# Patient Record
Sex: Female | Born: 1970
Health system: Southern US, Community
[De-identification: ages and names within clinical notes are randomized; demographics above are authoritative.]

## PROBLEM LIST (undated history)

## (undated) DIAGNOSIS — F329 Major depressive disorder, single episode, unspecified: Secondary | ICD-10-CM

## (undated) DIAGNOSIS — F32A Depression, unspecified: Secondary | ICD-10-CM

## (undated) DIAGNOSIS — Z923 Personal history of irradiation: Secondary | ICD-10-CM

## (undated) DIAGNOSIS — D0510 Intraductal carcinoma in situ of unspecified breast: Secondary | ICD-10-CM

## (undated) HISTORY — PX: DILATION AND CURETTAGE OF UTERUS: SHX78

## (undated) HISTORY — PX: BREAST SURGERY: SHX581

## (undated) HISTORY — PX: APPENDECTOMY: SHX54

---

## 2003-02-26 ENCOUNTER — Other Ambulatory Visit: Admission: RE | Admit: 2003-02-26 | Discharge: 2003-02-26 | Payer: Self-pay | Admitting: Obstetrics and Gynecology

## 2003-10-18 ENCOUNTER — Ambulatory Visit (HOSPITAL_COMMUNITY): Admission: RE | Admit: 2003-10-18 | Discharge: 2003-10-18 | Payer: Self-pay | Admitting: Obstetrics and Gynecology

## 2004-01-06 ENCOUNTER — Inpatient Hospital Stay (HOSPITAL_COMMUNITY): Admission: AD | Admit: 2004-01-06 | Discharge: 2004-01-09 | Payer: Self-pay | Admitting: Obstetrics and Gynecology

## 2004-01-07 ENCOUNTER — Encounter (INDEPENDENT_AMBULATORY_CARE_PROVIDER_SITE_OTHER): Payer: Self-pay | Admitting: Specialist

## 2006-08-02 ENCOUNTER — Encounter: Admission: RE | Admit: 2006-08-02 | Discharge: 2006-08-02 | Payer: Self-pay | Admitting: Obstetrics and Gynecology

## 2006-08-12 ENCOUNTER — Encounter: Admission: RE | Admit: 2006-08-12 | Discharge: 2006-08-12 | Payer: Self-pay | Admitting: Obstetrics and Gynecology

## 2009-11-11 ENCOUNTER — Encounter: Admission: RE | Admit: 2009-11-11 | Discharge: 2009-11-11 | Payer: Self-pay | Admitting: Obstetrics and Gynecology

## 2010-08-03 ENCOUNTER — Encounter: Payer: Self-pay | Admitting: Obstetrics and Gynecology

## 2011-09-11 ENCOUNTER — Other Ambulatory Visit (HOSPITAL_BASED_OUTPATIENT_CLINIC_OR_DEPARTMENT_OTHER): Payer: Self-pay | Admitting: Obstetrics and Gynecology

## 2011-09-11 DIAGNOSIS — Z1231 Encounter for screening mammogram for malignant neoplasm of breast: Secondary | ICD-10-CM

## 2011-09-16 ENCOUNTER — Ambulatory Visit (HOSPITAL_BASED_OUTPATIENT_CLINIC_OR_DEPARTMENT_OTHER)
Admission: RE | Admit: 2011-09-16 | Discharge: 2011-09-16 | Disposition: A | Discharge status: Home / Self Care | Payer: Managed Care, Other (non HMO) | Source: Ambulatory Visit | Attending: Obstetrics and Gynecology | Admitting: Obstetrics and Gynecology

## 2011-09-16 DIAGNOSIS — Z1231 Encounter for screening mammogram for malignant neoplasm of breast: Secondary | ICD-10-CM | POA: Insufficient documentation

## 2011-09-18 ENCOUNTER — Other Ambulatory Visit: Payer: Self-pay | Admitting: Obstetrics and Gynecology

## 2011-09-18 DIAGNOSIS — R928 Other abnormal and inconclusive findings on diagnostic imaging of breast: Secondary | ICD-10-CM

## 2011-09-30 ENCOUNTER — Ambulatory Visit
Admission: RE | Admit: 2011-09-30 | Discharge: 2011-09-30 | Disposition: A | Discharge status: Home / Self Care | Payer: Managed Care, Other (non HMO) | Source: Ambulatory Visit | Attending: Obstetrics and Gynecology | Admitting: Obstetrics and Gynecology

## 2011-09-30 DIAGNOSIS — R928 Other abnormal and inconclusive findings on diagnostic imaging of breast: Secondary | ICD-10-CM

## 2012-09-26 ENCOUNTER — Other Ambulatory Visit: Payer: Self-pay | Admitting: Obstetrics and Gynecology

## 2012-10-05 ENCOUNTER — Encounter (HOSPITAL_COMMUNITY): Payer: Self-pay

## 2012-11-07 ENCOUNTER — Encounter (HOSPITAL_COMMUNITY): Payer: Self-pay | Admitting: Pharmacist

## 2012-11-15 ENCOUNTER — Encounter (HOSPITAL_COMMUNITY): Admission: RE | Payer: Self-pay | Source: Ambulatory Visit

## 2012-11-15 ENCOUNTER — Encounter (HOSPITAL_COMMUNITY): Payer: Self-pay | Admitting: Anesthesiology

## 2012-11-15 ENCOUNTER — Ambulatory Visit (HOSPITAL_COMMUNITY)
Admission: RE | Admit: 2012-11-15 | Payer: Managed Care, Other (non HMO) | Source: Ambulatory Visit | Admitting: Obstetrics and Gynecology

## 2012-11-15 HISTORY — DX: Depression, unspecified: F32.A

## 2012-11-15 HISTORY — DX: Major depressive disorder, single episode, unspecified: F32.9

## 2012-11-15 SURGERY — DILATATION & CURETTAGE/HYSTEROSCOPY WITH NOVASURE ABLATION
Anesthesia: General

## 2017-03-19 ENCOUNTER — Encounter (HOSPITAL_BASED_OUTPATIENT_CLINIC_OR_DEPARTMENT_OTHER): Payer: Self-pay | Admitting: *Deleted

## 2017-03-19 ENCOUNTER — Emergency Department (HOSPITAL_BASED_OUTPATIENT_CLINIC_OR_DEPARTMENT_OTHER)
Admission: EM | Admit: 2017-03-19 | Discharge: 2017-03-19 | Disposition: A | Discharge status: Home / Self Care | Payer: Worker's Compensation | Attending: Emergency Medicine | Admitting: Emergency Medicine

## 2017-03-19 ENCOUNTER — Emergency Department (HOSPITAL_BASED_OUTPATIENT_CLINIC_OR_DEPARTMENT_OTHER): Payer: Worker's Compensation

## 2017-03-19 DIAGNOSIS — Y999 Unspecified external cause status: Secondary | ICD-10-CM | POA: Diagnosis not present

## 2017-03-19 DIAGNOSIS — W458XXA Other foreign body or object entering through skin, initial encounter: Secondary | ICD-10-CM | POA: Insufficient documentation

## 2017-03-19 DIAGNOSIS — S91141A Puncture wound with foreign body of right great toe without damage to nail, initial encounter: Secondary | ICD-10-CM | POA: Diagnosis not present

## 2017-03-19 DIAGNOSIS — Y929 Unspecified place or not applicable: Secondary | ICD-10-CM | POA: Diagnosis not present

## 2017-03-19 DIAGNOSIS — Y9301 Activity, walking, marching and hiking: Secondary | ICD-10-CM | POA: Insufficient documentation

## 2017-03-19 DIAGNOSIS — Z79899 Other long term (current) drug therapy: Secondary | ICD-10-CM | POA: Insufficient documentation

## 2017-03-19 DIAGNOSIS — T148XXA Other injury of unspecified body region, initial encounter: Secondary | ICD-10-CM

## 2017-03-19 DIAGNOSIS — S99921A Unspecified injury of right foot, initial encounter: Secondary | ICD-10-CM | POA: Diagnosis present

## 2017-03-19 MED ORDER — HYDROCODONE-ACETAMINOPHEN 5-325 MG PO TABS: 1.0000 | ORAL_TABLET | Freq: Four times a day (QID) | ORAL | 0 refills | Status: DC | PRN

## 2017-03-19 MED ORDER — CEPHALEXIN 500 MG PO CAPS: 500.0000 mg | ORAL_CAPSULE | Freq: Three times a day (TID) | ORAL | 0 refills | Status: DC

## 2017-03-19 MED ORDER — LIDOCAINE HCL 2 % IJ SOLN
5.0000 mL | Freq: Once | INTRAMUSCULAR | Status: AC
  Administered 2017-03-19: 100 mg via INTRADERMAL
  Filled 2017-03-19: qty 20

## 2017-03-19 MED FILL — CEPHALEXIN 500 MG CAPSULE: 500 | 7 days supply | Qty: 21 | Fill #0

## 2017-03-19 MED FILL — HYDROCODON-APAP 5-325: 5-325 | 1 days supply | Qty: 6 | Fill #0

## 2017-03-19 NOTE — ED Notes (Signed)
Bacitracin was placed on wound and wrapped with guaze.

## 2017-03-19 NOTE — Discharge Instructions (Signed)
There may be some of the splinter remaining in the skin of your toe. Perform soaks several times a day. Take Keflex as prescribed to prevent infection. Take pain medication as prescribed as needed for severe pain only. Follow-up with your doctor or podiatrist. Return if worsening.

## 2017-03-19 NOTE — ED Notes (Signed)
ED Provider at bedside. 

## 2017-03-19 NOTE — ED Provider Notes (Signed)
[facility] Provider Note   CSN: [id number] Arrival date & time: 03/19/17  2130     History   Chief Complaint Chief Complaint  Patient presents with  . Foreign Body in Skin    HPI Pier Laux Mathison is a 46 y.o. female.  HPI ALEYNA CUEVA is a 47 y.o. female who presents to emergency department complaining of a splinter in her right great toe. Patient states she was wearing open toe shoes yesterday, walking outside, felt something stab her in detail. She has tried remove the splinter herself, but was unable to. She has soaked her foot at home. She applied topical lidocaine which did not help. She denies any redness or swelling. No drainage. Tetanus within 5 years.  Past Medical History:  Diagnosis Date  . Depression     There are no active problems to display for this patient.   Past Surgical History:  Procedure Laterality Date  . BREAST SURGERY    . DILATION AND CURETTAGE OF UTERUS      OB History    No data available       Home Medications    Prior to Admission medications   Medication Sig Start Date End Date Taking? Authorizing Provider  BuPROPion HCl (WELLBUTRIN XL PO) Take by mouth.   Yes [provider]  Sertraline HCl (ZOLOFT PO) Take by mouth.   Yes [provider]    Family History No family history on file.  Social History Social History  Substance Use Topics  . Smoking status: Never Smoker  . Smokeless tobacco: Never Used  . Alcohol use Yes     Comment: monthly     Allergies   Erythromycin   Review of Systems Review of Systems  Constitutional: Negative for chills and fever.  Musculoskeletal: Positive for arthralgias.  Skin: Positive for wound. Negative for color change.     Physical Exam Updated Vital Signs BP 120/72 (BP Location: Left Arm)   Pulse 76   Temp 98.7 F (37.1 C) (Oral)   Resp 18   Ht  (1.676 m)   Wt 65.8 kg (145 lb)   SpO2 100%   BMI 23.40 kg/m   Physical Exam    Constitutional: She appears well-developed and well-nourished. No distress.  Eyes: Conjunctivae are normal.  Neck: Neck supple.  Neurological: She is alert.  Skin: Skin is warm and dry.  Black pin point foreign body in the tip of the right great toe just medial to the toenail.   Nursing note and vitals reviewed.    ED Treatments / Results  Labs (all labs ordered are listed, but only abnormal results are displayed) Labs Reviewed - No data to display  EKG  EKG Interpretation None       Radiology Dg Toe Great Right  Result Date: 03/19/2017 CLINICAL DATA:  Injury, possible foreign body EXAM: RIGHT GREAT TOE COMPARISON:  None available FINDINGS: Mild soft tissue swelling. No radiopaque foreign body. No acute osseous finding or malalignment. Negative for fracture. IMPRESSION: No acute finding by plain radiography Electronically Signed   By: Judie Petit.  Shick M.D.   On: 03/19/2017 11:21    Procedures .Foreign Body Removal Date/Time: 03/19/2017 11:39 AM Performed by: Jaynie Crumble Authorized by: Jaynie Crumble  Consent: Verbal consent obtained. Patient understanding: patient states understanding of the procedure being performed Body area: skin General location: lower extremity Location details: right big toe Anesthesia: local infiltration  Anesthesia: Local Anesthetic: lidocaine 1% without epinephrine  Sedation: Patient sedated:  no Complexity: complex 2 objects recovered. Objects recovered: piece of grass and pieces of splinter Post-procedure assessment: residual foreign bodies remain Patient tolerance: Patient tolerated the procedure well with no immediate complications   (including critical care time)  Medications Ordered in ED Medications  lidocaine (XYLOCAINE) 2 % (with pres) injection 100 mg (not administered)     Initial Impression / Assessment and Plan / ED Course  I have reviewed the triage vital signs and the nursing notes.  Pertinent labs & imaging  results that were available during my care of the patient were reviewed by me and considered in my medical decision making (see chart for details).     Patient with a splinter to the right great toe. Was able to remove parts of the splinter, and a long piece of grass, the splinter brakes whenever you try to pull on it. some pieces remains that I am able to visualize but unable to move. Discussed with dr. Silverio LayYao.  Will dc home with soaks, podiatry follow up, keflex. Return precautions discussed.   Vitals:   03/19/17 0930  BP: 120/72  Pulse: 76  Resp: 18  Temp: 98.7 F (37.1 C)  TempSrc: Oral  SpO2: 100%  Weight: 65.8 kg (145 lb)  Height: 5\' 6"  (1.676 m)      Final Clinical Impressions(s) / ED Diagnoses   Final diagnoses:  Foreign body in skin    New Prescriptions New Prescriptions   CEPHALEXIN (KEFLEX) 500 MG CAPSULE    Take 1 capsule (500 mg total) by mouth 3 (three) times daily.   HYDROCODONE-ACETAMINOPHEN (NORCO) 5-325 MG TABLET    Take 1 tablet by mouth every 6 (six) hours as needed for moderate pain.     Jaynie CrumbleKirichenko, Kalisi Bevill, PA-C 03/19/17 1147    Charlynne PanderYao, David Hsienta, MD 03/19/17 [phone]

## 2017-03-19 NOTE — ED Triage Notes (Signed)
Pt states she was walking in yard yesterday and got a splinter in her right great toe

## 2017-03-19 NOTE — ED Notes (Signed)
Pt given warm soap water to soak toe.

## 2018-12-18 IMAGING — CR DG TOE GREAT 2+V*R*
3 series · 3 of 3 positions shown · non-contrast
Comparison: None available

CLINICAL DATA: Injury, possible foreign body

EXAM:
RIGHT GREAT TOE

[t toes ap right]
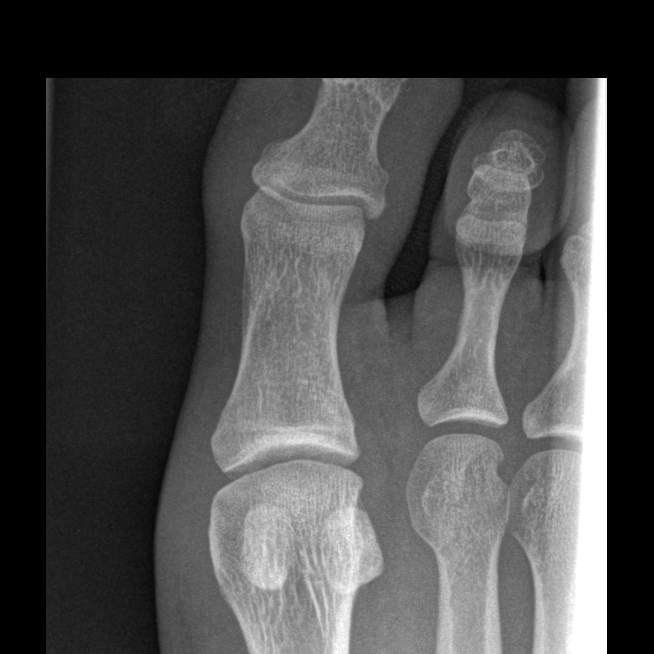

[t toes oblique right]
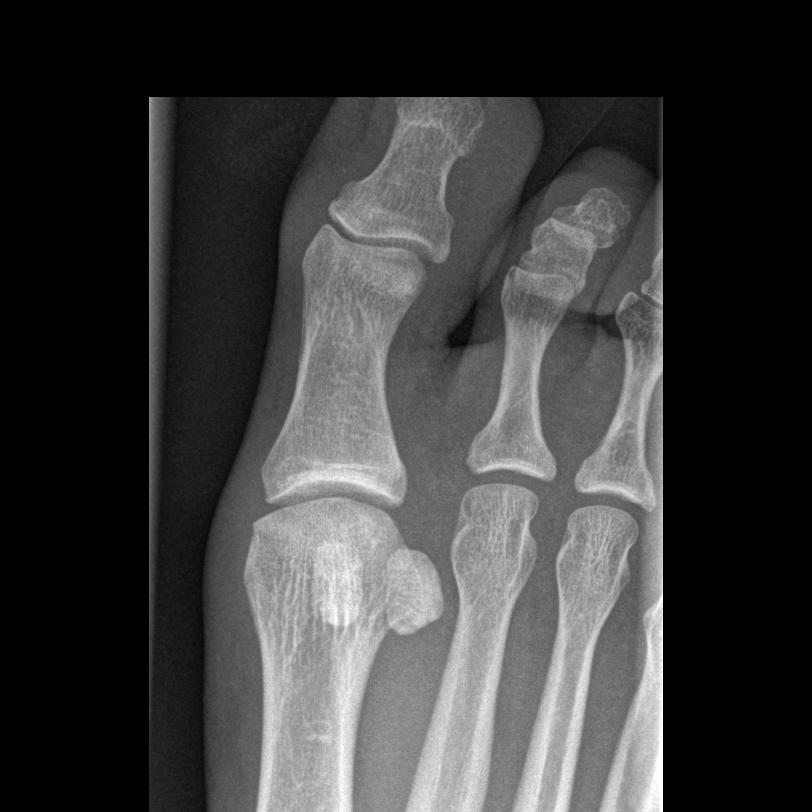

[t toes lateral right]
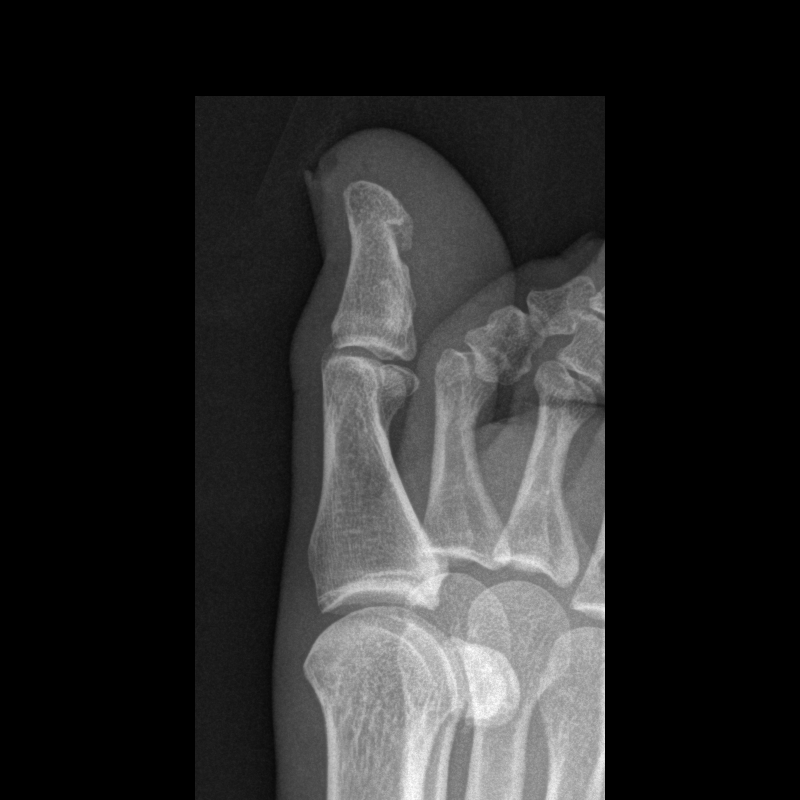

[3 of 3 positions shown; findings below may reference images not displayed]

FINDINGS: Mild soft tissue swelling. No radiopaque foreign body. No acute
osseous finding or malalignment. Negative for fracture.
IMPRESSION: No acute finding by plain radiography

## 2021-07-22 ENCOUNTER — Encounter: Payer: Managed Care, Other (non HMO) | Admitting: Genetic Counselor

## 2021-11-05 ENCOUNTER — Ambulatory Visit: Payer: Self-pay

## 2022-07-15 ENCOUNTER — Other Ambulatory Visit: Payer: Self-pay

## 2022-07-17 ENCOUNTER — Telehealth: Payer: Self-pay | Admitting: Hematology and Oncology

## 2022-07-17 NOTE — Telephone Encounter (Signed)
Spoke to patient to confirm upcoming afternoon [facility] clinic appointment  on 1/10, paperwork will be sent via [place]. [facility] Seamen location and time, also informed patient that the surgeon's office would be calling as well to get information from them similar to the packet that they will be receiving so make sure to do both.  Reminded patient that all providers will be coming to the clinic to see them HERE and if they had any questions to not hesitate to reach back out to myself or their navigators.

## 2022-07-17 NOTE — Telephone Encounter (Signed)
Left message for patient to return call in reference to upcoming clinic appointment on 1/10

## 2022-07-20 ENCOUNTER — Encounter: Payer: Self-pay | Admitting: *Deleted

## 2022-07-20 ENCOUNTER — Encounter: Payer: Self-pay | Admitting: Obstetrics and Gynecology

## 2022-07-20 DIAGNOSIS — D0511 Intraductal carcinoma in situ of right breast: Secondary | ICD-10-CM | POA: Insufficient documentation

## 2022-07-21 NOTE — Progress Notes (Signed)
Radiation Oncology         [phone] ________________________________  Multidisciplinary Breast Oncology Clinic [facility]) Initial Outpatient Consultation  Name: Samantha Mcdowell MRN: [id number]  Date: 07/22/2022  DOB: Nov 07, 1970  YS:[id number], Nena Jordan, MD  Harriette Bouillon, MD   REFERRING PHYSICIAN: Harriette Bouillon, MD  DIAGNOSIS: There were no encounter diagnoses.  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  HISTORY OF PRESENT ILLNESS::Samantha Mcdowell is a 52 y.o. female who is presenting to the office today for evaluation of her newly diagnosed breast cancer. She is accompanied by ***. She is doing well overall.   The patient presented with pain in the 12 o'clock region of both breasts x 1 month. This prompted a bilateral diagnostic mammography with tomography at [facility] on 07/01/22 showing: indeterminate grouped course calcifications in the right breast. Ultrasound of the right breast on that same date further revealed a hypoechoic area with possible calcifications in the 10 o'clock right breast, located 5 cmfn, possibly but not definitely correlating with mammographic findings. [place] otherwise showed no findings in the left breast or evidence of lymphadenopathy in either axilla.   Biopsy of the right breast on 07/15/22 showed: high grade DCIS measuring 0.8 cm in the greatest linear extent of the sample, with necrosis and calcifications. Prognostic indicators significant for: estrogen receptor, 40% positive with weak-moderate staining intensity and progesterone receptor, 2% positive with moderate-strong staining intensity. HER2 not assessed.  Menarche: *** years old Age at first live birth: *** years old GP: *** LMP: *** Contraceptive: *** HRT: ***   The patient was referred today for presentation in the multidisciplinary conference.  Radiology studies and pathology slides were presented there  for review and discussion of treatment options.  A consensus was discussed regarding potential next steps.  PREVIOUS RADIATION THERAPY: {EXAM; YES/NO:19492::"No"}  PAST MEDICAL HISTORY:  Past Medical History:  Diagnosis Date   Depression     PAST SURGICAL HISTORY: Past Surgical History:  Procedure Laterality Date   BREAST SURGERY     DILATION AND CURETTAGE OF UTERUS      FAMILY HISTORY: No family history on file.  SOCIAL HISTORY:  Social History   Socioeconomic History   Marital status: Married    Spouse name: Not on file   Number of children: Not on file   Years of education: Not on file   Highest education level: Not on file  Occupational History   Not on file  Tobacco Use   Smoking status: Never   Smokeless tobacco: Never  Vaping Use   Vaping Use: Never used  Substance and Sexual Activity   Alcohol use: Yes    Comment: monthly   Drug use: No   Sexual activity: Not on file  Other Topics Concern   Not on file  Social History Narrative   Not on file   Social Determinants of Health   Financial Resource Strain: Not on file  Food Insecurity: Not on file  Transportation Needs: Not on file  Physical Activity: Not on file  Stress: Not on file  Social Connections: Not on file    ALLERGIES:  Allergies  Allergen Reactions   Erythromycin Nausea Only    MEDICATIONS:  Current Outpatient Medications  Medication Sig Dispense Refill   BuPROPion HCl (WELLBUTRIN XL PO) Take by mouth.     cephALEXin (KEFLEX) 500 MG capsule Take 1 capsule (500 mg total) by mouth 3 (three) times  daily. 21 capsule 0   HYDROcodone-acetaminophen (NORCO) 5-325 MG tablet Take 1 tablet by mouth every 6 (six) hours as needed for moderate pain. 6 tablet 0   Sertraline HCl (ZOLOFT PO) Take by mouth.     No current facility-administered medications for this encounter.    REVIEW OF SYSTEMS: A 10+ POINT REVIEW OF SYSTEMS WAS OBTAINED including neurology, dermatology, psychiatry, cardiac,  respiratory, lymph, extremities, GI, GU, musculoskeletal, constitutional, reproductive, HEENT. On the provided form, she reports ***. She denies *** and any other symptoms.    PHYSICAL EXAM:  vitals were not taken for this visit.    Lungs are clear to auscultation bilaterally. Heart has regular rate and rhythm. No palpable cervical, supraclavicular, or axillary adenopathy. Abdomen soft, non-tender, normal bowel sounds. Breast: Left breast with no palpable mass, nipple discharge, or bleeding. Right breast with ***.   KPS = ***  100 - Normal; no complaints; no evidence of disease. 90   - Able to carry on normal activity; minor signs or symptoms of disease. 80   - Normal activity with effort; some signs or symptoms of disease. 46   - Cares for self; unable to carry on normal activity or to do active work. 60   - Requires occasional assistance, but is able to care for most of his personal needs. 50   - Requires considerable assistance and frequent medical care. 39   - Disabled; requires special care and assistance. 43   - Severely disabled; hospital admission is indicated although death not imminent. 43   - Very sick; hospital admission necessary; active supportive treatment necessary. 10   - Moribund; fatal processes progressing rapidly. 0     - Dead  Karnofsky DA, Abelmann WH, Craver LS and Burchenal JH [phone]) The use of the nitrogen mustards in the palliative treatment of carcinoma: with particular reference to bronchogenic carcinoma Cancer 1 [id number]  LABORATORY DATA:  No results found for: "WBC", "HGB", "HCT", "MCV", "PLT" No results found for: "NA", "K", "CL", "CO2" No results found for: "ALT", "AST", "GGT", "ALKPHOS", "BILITOT"  PULMONARY FUNCTION TEST:   Review Flowsheet        No data to display          RADIOGRAPHY: No results found.    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 We discussed the general course of radiation, potential side effects, and toxicities with radiation and the patient is interested in this approach. ***   PLAN:  ***   ------------------------------------------------  Blair Promise, PhD, MD  This document serves as a record of services personally performed by Gery Pray, MD. It was created on his behalf by Roney Mans, a trained medical scribe. The creation of this record is based on the scribe's personal observations and the provider's statements to them. This document has been checked and approved by the attending provider.

## 2022-07-22 ENCOUNTER — Ambulatory Visit
Admission: RE | Admit: 2022-07-22 | Discharge: 2022-07-22 | Disposition: A | Discharge status: Home / Self Care | Payer: BC Managed Care – PPO | Source: Ambulatory Visit | Attending: Radiation Oncology | Admitting: Radiation Oncology

## 2022-07-22 ENCOUNTER — Encounter: Payer: Self-pay | Admitting: *Deleted

## 2022-07-22 ENCOUNTER — Ambulatory Visit: Payer: BC Managed Care – PPO | Admitting: Physical Therapy

## 2022-07-22 ENCOUNTER — Other Ambulatory Visit: Payer: Self-pay

## 2022-07-22 ENCOUNTER — Ambulatory Visit: Payer: Self-pay | Admitting: Surgery

## 2022-07-22 ENCOUNTER — Inpatient Hospital Stay (HOSPITAL_BASED_OUTPATIENT_CLINIC_OR_DEPARTMENT_OTHER): Payer: BC Managed Care – PPO | Admitting: Hematology and Oncology

## 2022-07-22 ENCOUNTER — Inpatient Hospital Stay (HOSPITAL_BASED_OUTPATIENT_CLINIC_OR_DEPARTMENT_OTHER): Payer: BC Managed Care – PPO | Admitting: Genetic Counselor

## 2022-07-22 ENCOUNTER — Inpatient Hospital Stay: Payer: BC Managed Care – PPO | Attending: Hematology and Oncology

## 2022-07-22 ENCOUNTER — Encounter: Payer: Self-pay | Admitting: General Practice

## 2022-07-22 VITALS — BP 109/51 | HR 70 | Temp 97.6°F | Resp 18 | Ht 66.0 in | Wt 153.0 lb

## 2022-07-22 DIAGNOSIS — D0511 Intraductal carcinoma in situ of right breast: Secondary | ICD-10-CM

## 2022-07-22 DIAGNOSIS — Z808 Family history of malignant neoplasm of other organs or systems: Secondary | ICD-10-CM | POA: Insufficient documentation

## 2022-07-22 LAB — CBC WITH DIFFERENTIAL (CANCER CENTER ONLY)
Abs Immature Granulocytes: 0.01 10*3/uL (ref 0.00–0.07)
Basophils Absolute: 0.1 10*3/uL (ref 0.0–0.1)
Basophils Relative: 1 %
Eosinophils Absolute: 0.2 10*3/uL (ref 0.0–0.5)
Eosinophils Relative: 4 %
HCT: 40.7 % (ref 36.0–46.0)
Hemoglobin: 13.8 g/dL (ref 12.0–15.0)
Immature Granulocytes: 0 %
Lymphocytes Relative: 42 %
Lymphs Abs: 2.3 10*3/uL (ref 0.7–4.0)
MCH: 30.1 pg (ref 26.0–34.0)
MCHC: 33.9 g/dL (ref 30.0–36.0)
MCV: 88.7 fL (ref 80.0–100.0)
Monocytes Absolute: 0.3 10*3/uL (ref 0.1–1.0)
Monocytes Relative: 6 %
Neutro Abs: 2.6 10*3/uL (ref 1.7–7.7)
Neutrophils Relative %: 47 %
Platelet Count: 301 10*3/uL (ref 150–400)
RBC: 4.59 MIL/uL (ref 3.87–5.11)
RDW: 12.5 % (ref 11.5–15.5)
WBC Count: 5.5 10*3/uL (ref 4.0–10.5)
nRBC: 0 % (ref 0.0–0.2)

## 2022-07-22 LAB — CMP (CANCER CENTER ONLY)
ALT: 35 U/L (ref 0–44)
AST: 26 U/L (ref 15–41)
Albumin: 4.4 g/dL (ref 3.5–5.0)
Alkaline Phosphatase: 126 U/L (ref 38–126)
Anion gap: 3 — ABNORMAL LOW (ref 5–15)
BUN: 17 mg/dL (ref 6–20)
CO2: 32 mmol/L (ref 22–32)
Calcium: 9.3 mg/dL (ref 8.9–10.3)
Chloride: 105 mmol/L (ref 98–111)
Creatinine: 0.85 mg/dL (ref 0.44–1.00)
GFR, Estimated: >60 mL/min (ref >60)
Glucose, Bld: 86 mg/dL (ref 70–99)
Potassium: 3.7 mmol/L (ref 3.5–5.1)
Sodium: 140 mmol/L (ref 135–145)
Total Bilirubin: 0.3 mg/dL (ref 0.3–1.2)
Total Protein: 7.1 g/dL (ref 6.5–8.1)

## 2022-07-22 LAB — GENETIC SCREENING ORDER

## 2022-07-22 NOTE — Progress Notes (Signed)
[place] Psychosocial Distress Screening Spiritual Care  Spiritual Care was referred by distress screening protocol.  The patient scored a  [unspecified]  on the Psychosocial Distress Thermometer which indicates  [unspecified]  distress. Chaplain met with Ms Tieken and her fiance Henrene Pastor in exam room to assess for distress and other psychosocial needs.     Distress Screen:    07/22/2022    2:39 PM  ONCBCN DISTRESS SCREENING  Screening Type Initial Screening  Emotional problem type Nervousness/Anxiety  Referral to support programs Yes    Chaplain and patient discussed common feelings and emotions when being diagnosed with cancer, and the importance of support during treatment.  Chaplain informed patient of the support team and support services at [facility]  Chaplain provided contact information and encouraged patient to call with any questions or concerns.  Chaplain follow up needed: No.  Ms Frith plans to reach out as needed/desired.    Patient is participating in a Managed Medicaid Plan:  no

## 2022-07-22 NOTE — Progress Notes (Signed)
[place] CONSULT NOTE  Patient Care Team: Servando Salina, MD as PCP - General (Obstetrics and Gynecology) Mauro Kaufmann, RN as Oncology Nurse Navigator Rockwell Germany, RN as Oncology Nurse Navigator Nicholas Lose, MD as Consulting Physician (Hematology and Oncology) Gery Pray, MD as Consulting Physician (Radiation Oncology) Erroll Luna, MD as Consulting Physician (General Surgery)  CHIEF COMPLAINTS/PURPOSE OF CONSULTATION:  Newly diagnosed right breast DCIS  HISTORY OF PRESENTING ILLNESS:  Samantha Mcdowell 52 y.o. female is here because of recent diagnosis of right breast DCIS.  Patient complained of focal pain in bilateral breast but there was no abnormality at the site of the pain.  Incidentally mammogram detected asymmetry and calcifications which on biopsy came back as high-grade DCIS that was ER/PR positive.  She was presented this morning to the multidisciplinary tumor board and she is here today accompanied by her husband to discuss her treatment plan.  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  I reviewed her records extensively and collaborated the history with the patient.  SUMMARY OF ONCOLOGIC HISTORY: Oncology History  Ductal carcinoma in situ (DCIS) of right breast  07/15/2022 Initial Diagnosis   Focal pain in bilateral breasts. mammogram detected Occasions and asymmetry in the right breast upper outer quadrant (implants).  No ultrasound correlate.  Biopsy revealed high-grade DCIS with necrosis ER 40% PR 2%   07/22/2022 Cancer Staging   Staging form: Breast, AJCC 8th Edition - Clinical: Stage 0 (cTis (DCIS), cN0, cM0, ER+, PR+, HER2: Not Assessed) - Signed by Nicholas Lose, MD on 07/22/2022 Stage prefix: Initial diagnosis Nuclear grade: G3      MEDICAL HISTORY:  Past Medical History:  Diagnosis Date   Depression     SURGICAL HISTORY: Past Surgical History:  Procedure Laterality Date   APPENDECTOMY     BREAST SURGERY      DILATION AND CURETTAGE OF UTERUS      SOCIAL HISTORY: Social History   Socioeconomic History   Marital status: Married    Spouse name: Not on file   Number of children: Not on file   Years of education: Not on file   Highest education level: Not on file  Occupational History   Not on file  Tobacco Use   Smoking status: Never   Smokeless tobacco: Never  Vaping Use   Vaping Use: Never used  Substance and Sexual Activity   Alcohol use: Yes    Comment: monthly   Drug use: No   Sexual activity: Not on file  Other Topics Concern   Not on file  Social History Narrative   Not on file   Social Determinants of Health   Financial Resource Strain: Not on file  Food Insecurity: Not on file  Transportation Needs: Not on file  Physical Activity: Not on file  Stress: Not on file  Social Connections: Not on file  Intimate Partner Violence: Not on file    FAMILY HISTORY: Family History  Problem Relation Age of Onset   Uterine cancer Mother    Melanoma Father     ALLERGIES:  is allergic to erythromycin.  MEDICATIONS:  Current Outpatient Medications  Medication Sig Dispense Refill   BuPROPion HCl (WELLBUTRIN XL PO) Take by mouth.     Sertraline HCl (ZOLOFT PO) Take by mouth.     No current facility-administered medications for this visit.    REVIEW OF SYSTEMS:   Constitutional: Denies fevers, chills or abnormal night sweats   All other systems were reviewed  with the patient and are negative.  PHYSICAL EXAMINATION: ECOG PERFORMANCE STATUS: 0 - Asymptomatic  Vitals:   07/22/22 1244  BP: (!) 109/51  Pulse: 70  Resp: 18  Temp: 97.6 F (36.4 C)  SpO2: 100%   Filed Weights   07/22/22 1244  Weight: 153 lb (69.4 kg)    GENERAL:alert, no distress and comfortable    LABORATORY DATA:  I have reviewed the data as listed Lab Results  Component Value Date   WBC 5.5 07/22/2022   HGB 13.8 07/22/2022   HCT 40.7 07/22/2022   MCV 88.7 07/22/2022   PLT 301  07/22/2022   Lab Results  Component Value Date   NA 140 07/22/2022   K 3.7 07/22/2022   CL 105 07/22/2022   CO2 32 07/22/2022    RADIOGRAPHIC STUDIES: I have personally reviewed the radiological reports and agreed with the findings in the report.  ASSESSMENT AND PLAN:  Ductal carcinoma in situ (DCIS) of right breast 07/15/2022:Focal pain in bilateral breasts. mammogram detected Occasions and asymmetry in the right breast upper outer quadrant (implants).  No ultrasound correlate.  Biopsy revealed high-grade DCIS with necrosis ER 40% PR 2%  Pathology review: I discussed with the patient the difference between DCIS and invasive breast cancer. It is considered a precancerous lesion. DCIS is classified as a 0. It is generally detected through mammograms as calcifications. We discussed the significance of grades and its impact on prognosis. We also discussed the importance of ER and PR receptors and their implications to adjuvant treatment options. Prognosis of DCIS dependence on grade, comedo necrosis. It is anticipated that if not treated, 20-30% of DCIS can develop into invasive breast cancer.  Recommendation: 1. Breast conserving surgery 2. Followed by adjuvant radiation therapy 3. Followed by antiestrogen therapy with tamoxifen 5 years  Tamoxifen counseling: We discussed the risks and benefits of tamoxifen. These include but not limited to insomnia, hot flashes, mood changes, vaginal dryness, and weight gain. Although rare, serious side effects including endometrial cancer, risk of blood clots were also discussed. We strongly believe that the benefits far outweigh the risks. Patient understands these risks and consented to starting treatment. Planned treatment duration is 5 years.  Return to clinic after surgery to discuss the final pathology report and come up with an adjuvant treatment plan.   All questions were answered. The patient knows to call the clinic with any problems, questions  or concerns.    Harriette Ohara, MD 07/22/22

## 2022-07-22 NOTE — Assessment & Plan Note (Signed)
07/15/2022:Focal pain in bilateral breasts. mammogram detected Occasions and asymmetry in the right breast upper outer quadrant (implants).  No ultrasound correlate.  Biopsy revealed high-grade DCIS with necrosis ER 40% PR 2%  Pathology review: I discussed with the patient the difference between DCIS and invasive breast cancer. It is considered a precancerous lesion. DCIS is classified as a 0. It is generally detected through mammograms as calcifications. We discussed the significance of grades and its impact on prognosis. We also discussed the importance of ER and PR receptors and their implications to adjuvant treatment options. Prognosis of DCIS dependence on grade, comedo necrosis. It is anticipated that if not treated, 20-30% of DCIS can develop into invasive breast cancer.  Recommendation: 1. Breast conserving surgery 2. Followed by adjuvant radiation therapy 3. Followed by antiestrogen therapy with tamoxifen 5 years  Tamoxifen counseling: We discussed the risks and benefits of tamoxifen. These include but not limited to insomnia, hot flashes, mood changes, vaginal dryness, and weight gain. Although rare, serious side effects including endometrial cancer, risk of blood clots were also discussed. We strongly believe that the benefits far outweigh the risks. Patient understands these risks and consented to starting treatment. Planned treatment duration is 5 years.  Return to clinic after surgery to discuss the final pathology report and come up with an adjuvant treatment plan.

## 2022-07-23 ENCOUNTER — Encounter: Payer: Self-pay | Admitting: Genetic Counselor

## 2022-07-23 NOTE — Progress Notes (Signed)
REFERRING PROVIDER: Nicholas Lose, MD  PRIMARY PROVIDER:  Servando Salina, MD  PRIMARY REASON FOR VISIT:  1. Ductal carcinoma in situ (DCIS) of right breast    HISTORY OF PRESENT ILLNESS:   Samantha Mcdowell, a 52 y.o. female, was seen for a [place] cancer genetics consultation during the breast multidisciplinary clinic at the request of Dr. Lindi Adie due to a personal history of cancer.  Samantha Mcdowell presents to clinic today to discuss the possibility of a hereditary predisposition to cancer, to discuss genetic testing, and to further clarify her future cancer risks, as well as potential cancer risks for family members.   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  CANCER HISTORY:  Oncology History  Ductal carcinoma in situ (DCIS) of right breast  07/15/2022 Initial Diagnosis   Focal pain in bilateral breasts. mammogram detected Occasions and asymmetry in the right breast upper outer quadrant (implants).  No ultrasound correlate.  Biopsy revealed high-grade DCIS with necrosis ER 40% PR 2%   07/22/2022 Cancer Staging   Staging form: Breast, AJCC 8th Edition - Clinical: Stage 0 (cTis (DCIS), cN0, cM0, ER+, PR+, HER2: Not Assessed) - Signed by Nicholas Lose, MD on 07/22/2022 Stage prefix: Initial diagnosis Nuclear grade: G3      RISK FACTORS:  Menarche was at age 51.  First live birth at age 64.  OCP use for approximately 10+ years.  Ovaries intact: yes.  Uterus intact: yes.  HRT use: 0 years. Colonoscopy: yes;  05/2020 . Mammogram within the last year: yes. Any excessive radiation exposure in the past: no  Past Medical History:  Diagnosis Date   Depression     Past Surgical History:  Procedure Laterality Date   APPENDECTOMY     BREAST SURGERY     DILATION AND CURETTAGE OF UTERUS      Social History   Socioeconomic History   Marital status: Married    Spouse name: Not on file    Number of children: Not on file   Years of education: Not on file   Highest education level: Not on file  Occupational History   Not on file  Tobacco Use   Smoking status: Never   Smokeless tobacco: Never  Vaping Use   Vaping Use: Never used  Substance and Sexual Activity   Alcohol use: Yes    Comment: monthly   Drug use: No   Sexual activity: Not on file  Other Topics Concern   Not on file  Social History Narrative   Not on file   Social Determinants of Health   Financial Resource Strain: Not on file  Food Insecurity: Not on file  Transportation Needs: Not on file  Physical Activity: Not on file  Stress: Not on file  Social Connections: Not on file     FAMILY HISTORY:  We obtained a detailed, 4-generation family history.  Significant diagnoses are listed below: Family History  Problem Relation Age of Onset   Uterine cancer Mother 81   Melanoma Father 75       metastatic, agent orange   Lung cancer Paternal Grandfather        smoked     Samantha Mcdowell's mother was diagnosed with uterine cancer at age 50. Her father was diagnosed with metastatic melanoma at age 73, he was exposed to agent orange and died at age 100. Her paternal grandfather was diagnosed with lung cancer, he smoked  and is deceased. Samantha Mcdowell is unaware of previous family history of genetic testing for hereditary cancer risks. There is no reported Ashkenazi Jewish ancestry.   GENETIC COUNSELING ASSESSMENT: Samantha Mcdowell is a 52 y.o. female with a personal history of cancer which is somewhat suggestive of a hereditary cancer syndrome and predisposition to cancer given her young age at diagnosis (under age 50). We, therefore, discussed and recommended the following at today's visit.   DISCUSSION: We discussed that 5 - 10% of cancer is hereditary, with most cases of hereditary breast cancer associated with mutations in BRCA1/2.  There are other genes that can be associated with hereditary breast cancer syndromes.  Type of cancer risk and level of risk are gene-specific. We discussed that testing is beneficial for several reasons including knowing how to follow individuals after completing their treatment, identifying whether potential treatment options would be beneficial, and understanding if other family members could be at risk for cancer and allowing them to undergo genetic testing.   We reviewed the characteristics, features and inheritance patterns of hereditary cancer syndromes. We also discussed genetic testing, including the appropriate family members to test, the process of testing, insurance coverage and turn-around-time for results. We discussed the implications of a negative, positive and/or variant of uncertain significant result. In order to get genetic test results in a timely manner so that Samantha Mcdowell can use these genetic test results for surgical decisions, we recommended Samantha Mcdowell pursue genetic testing for the BRCAplus. Once complete, we recommend Samantha Mcdowell pursue reflex genetic testing to a more comprehensive gene panel.   Samantha Mcdowell  was offered a common hereditary cancer panel (47 genes) and an expanded pan-cancer panel (77 genes). Samantha Mcdowell was informed of the benefits and limitations of each panel, including that expanded pan-cancer panels contain genes that do not have clear management guidelines at this point in time.  We also discussed that as the number of genes included on a panel increases, the chances of variants of uncertain significance increases.  After considering the benefits and limitations of each gene panel, Samantha Mcdowell elected to have [facility] CancerNext-Expanded Panel.  The CancerNext-Expanded gene panel offered by [facility] and includes sequencing, rearrangement, and RNA analysis for the following 77 genes: AIP, ALK, APC, ATM, AXIN2, BAP1, BARD1, BLM, BMPR1A, BRCA1, BRCA2, BRIP1, CDC73, CDH1, CDK4, CDKN1B, CDKN2A, CHEK2, CTNNA1, DICER1, FANCC, FH, FLCN, GALNT12,  KIF1B, LZTR1, MAX, MEN1, MET, MLH1, MSH2, MSH3, MSH6, MUTYH, NBN, NF1, NF2, NTHL1, PALB2, PHOX2B, PMS2, POT1, PRKAR1A, PTCH1, PTEN, RAD51C, RAD51D, RB1, RECQL, RET, SDHA, SDHAF2, SDHB, SDHC, SDHD, SMAD4, SMARCA4, SMARCB1, SMARCE1, STK11, SUFU, TMEM127, TP53, TSC1, TSC2, VHL and XRCC2 (sequencing and deletion/duplication); EGFR, EGLN1, HOXB13, KIT, MITF, PDGFRA, POLD1, and POLE (sequencing only); EPCAM and GREM1 (deletion/duplication only).   Based on Samantha Mcdowell's personal and family history of cancer, she does not meet NCCN criteria for genetic testing. However, we are currently recommending genetic testing to all women diagnosed with breast cancer under age 63. She may have an out of pocket cost. We discussed that if her out of pocket cost for testing is over $100, the laboratory should contact them to discuss self-pay prices, patient pay assistance programs, if applicable, and other billing options.   PLAN: After considering the risks, benefits, and limitations, Samantha Mcdowell provided informed consent to pursue genetic testing and the blood sample was sent to [facility] for analysis of the CancerNext-Expanded Panel. Results should be available within approximately 1-2 weeks' time, at which point they  will be disclosed by telephone to Samantha Mcdowell, as will any additional recommendations warranted by these results. Samantha Mcdowell will receive a summary of her genetic counseling visit and a copy of her results once available. This information will also be available in Epic.   Samantha Mcdowell's questions were answered to her satisfaction today. Our contact information was provided should additional questions or concerns arise. Thank you for the referral and allowing [place] to share in the care of your patient.   Lalla Brothers, MS, [facility] Genetic Counselor [email or web address] (P) [phone]  The patient was seen for a total of 20 minutes in face-to-face genetic counseling.  The patient brought her  fiance. Drs. Pamelia Hoit and/or Mosetta Putt were available to discuss this case as needed.  _______________________________________________________________________ For Office Staff:  Number of people involved in session: 2 Was an Intern/ student involved with case: no

## 2022-07-24 ENCOUNTER — Other Ambulatory Visit: Payer: Self-pay | Admitting: *Deleted

## 2022-07-24 ENCOUNTER — Telehealth: Payer: Self-pay | Admitting: Radiation Oncology

## 2022-07-24 DIAGNOSIS — D0511 Intraductal carcinoma in situ of right breast: Secondary | ICD-10-CM

## 2022-07-24 NOTE — Telephone Encounter (Signed)
Called patient to schedule a consultation w. Dr. Sondra Come. No answer, LVM for a return call.

## 2022-07-27 ENCOUNTER — Encounter: Payer: Self-pay | Admitting: Radiation Oncology

## 2022-07-30 ENCOUNTER — Encounter: Payer: Self-pay | Admitting: *Deleted

## 2022-07-30 ENCOUNTER — Telehealth: Payer: Self-pay | Admitting: *Deleted

## 2022-07-30 NOTE — Telephone Encounter (Signed)
Left message for a return phone call to follow up from [facility] 1/10 and assess navigation needs. 

## 2022-07-31 ENCOUNTER — Encounter: Payer: Self-pay | Admitting: Genetic Counselor

## 2022-07-31 ENCOUNTER — Ambulatory Visit
Admission: RE | Admit: 2022-07-31 | Discharge: 2022-07-31 | Disposition: A | Discharge status: Home / Self Care | Payer: Self-pay | Source: Ambulatory Visit | Attending: Radiation Oncology | Admitting: Radiation Oncology

## 2022-07-31 ENCOUNTER — Inpatient Hospital Stay
Admission: RE | Admit: 2022-07-31 | Discharge: 2022-07-31 | Disposition: A | Discharge status: Home / Self Care | Payer: Self-pay | Source: Ambulatory Visit | Attending: Radiation Oncology | Admitting: Radiation Oncology

## 2022-07-31 ENCOUNTER — Other Ambulatory Visit: Payer: Self-pay | Admitting: Radiation Oncology

## 2022-07-31 ENCOUNTER — Telehealth: Payer: Self-pay | Admitting: Genetic Counselor

## 2022-07-31 DIAGNOSIS — D0511 Intraductal carcinoma in situ of right breast: Secondary | ICD-10-CM

## 2022-07-31 DIAGNOSIS — Z1379 Encounter for other screening for genetic and chromosomal anomalies: Secondary | ICD-10-CM | POA: Insufficient documentation

## 2022-07-31 NOTE — Telephone Encounter (Addendum)
I contacted Ms. Skalsky to discuss her genetic testing results. No pathogenic variants were identified in the 77 genes analyzed. 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 Detailed clinic note to follow.  The test report has been scanned into EPIC and is located under the Molecular Pathology section of the Results Review tab.  A portion of the result report is included below for reference.   Lucille Passy, MS, [facility] Genetic Counselor [email or web address] (P) [phone]

## 2022-08-04 ENCOUNTER — Encounter (HOSPITAL_BASED_OUTPATIENT_CLINIC_OR_DEPARTMENT_OTHER): Payer: Self-pay | Admitting: Surgery

## 2022-08-04 ENCOUNTER — Encounter: Payer: Self-pay | Admitting: Genetic Counselor

## 2022-08-06 ENCOUNTER — Ambulatory Visit: Payer: Self-pay | Admitting: Surgery

## 2022-08-06 ENCOUNTER — Other Ambulatory Visit: Payer: Self-pay

## 2022-08-06 ENCOUNTER — Encounter (HOSPITAL_BASED_OUTPATIENT_CLINIC_OR_DEPARTMENT_OTHER): Payer: Self-pay | Admitting: Surgery

## 2022-08-06 DIAGNOSIS — D0511 Intraductal carcinoma in situ of right breast: Secondary | ICD-10-CM

## 2022-08-08 ENCOUNTER — Encounter: Payer: Self-pay | Admitting: Hematology and Oncology

## 2022-08-10 ENCOUNTER — Telehealth: Payer: Self-pay | Admitting: *Deleted

## 2022-08-10 MED ORDER — CHLORHEXIDINE GLUCONATE CLOTH 2 % EX PADS: 6.0000 | MEDICATED_PAD | Freq: Once | CUTANEOUS | Status: DC

## 2022-08-10 NOTE — Telephone Encounter (Signed)
Called pt, left vm informing pt she will need a front closed supportive bra without an under wire. Discussed reasoning as well as locations bra can be purchased.

## 2022-08-10 NOTE — Progress Notes (Signed)

## 2022-08-11 ENCOUNTER — Other Ambulatory Visit: Payer: Self-pay

## 2022-08-11 ENCOUNTER — Encounter (HOSPITAL_BASED_OUTPATIENT_CLINIC_OR_DEPARTMENT_OTHER): Payer: Self-pay | Admitting: Surgery

## 2022-08-11 ENCOUNTER — Telehealth: Payer: Self-pay | Admitting: *Deleted

## 2022-08-11 ENCOUNTER — Ambulatory Visit (HOSPITAL_BASED_OUTPATIENT_CLINIC_OR_DEPARTMENT_OTHER)
Admission: RE | Admit: 2022-08-11 | Discharge: 2022-08-11 | Disposition: A | Discharge status: Home / Self Care | Payer: BC Managed Care – PPO | Attending: Surgery | Admitting: Surgery

## 2022-08-11 ENCOUNTER — Ambulatory Visit (HOSPITAL_BASED_OUTPATIENT_CLINIC_OR_DEPARTMENT_OTHER): Payer: BC Managed Care – PPO | Admitting: Certified Registered"

## 2022-08-11 ENCOUNTER — Encounter (HOSPITAL_BASED_OUTPATIENT_CLINIC_OR_DEPARTMENT_OTHER)
Admission: RE | Disposition: A | Discharge status: Home / Self Care | Payer: Self-pay | Source: Home / Self Care | Attending: Surgery

## 2022-08-11 DIAGNOSIS — Z9882 Breast implant status: Secondary | ICD-10-CM | POA: Diagnosis not present

## 2022-08-11 DIAGNOSIS — Z8542 Personal history of malignant neoplasm of other parts of uterus: Secondary | ICD-10-CM | POA: Diagnosis not present

## 2022-08-11 DIAGNOSIS — D0511 Intraductal carcinoma in situ of right breast: Secondary | ICD-10-CM | POA: Diagnosis present

## 2022-08-11 DIAGNOSIS — F32A Depression, unspecified: Secondary | ICD-10-CM | POA: Diagnosis not present

## 2022-08-11 DIAGNOSIS — Z170 Estrogen receptor positive status [ER+]: Secondary | ICD-10-CM | POA: Insufficient documentation

## 2022-08-11 HISTORY — DX: Intraductal carcinoma in situ of unspecified breast: D05.10

## 2022-08-11 HISTORY — PX: BREAST LUMPECTOMY WITH RADIOACTIVE SEED LOCALIZATION: SHX6424

## 2022-08-11 SURGERY — BREAST LUMPECTOMY WITH RADIOACTIVE SEED LOCALIZATION
Anesthesia: General | Site: Breast | Laterality: Right

## 2022-08-11 MED ORDER — ACETAMINOPHEN 10 MG/ML IV SOLN
1000.0000 mg | Freq: Once | INTRAVENOUS | Status: DC | PRN
  Administered 2022-08-11: 1000 mg via INTRAVENOUS

## 2022-08-11 MED ORDER — FENTANYL CITRATE (PF) 100 MCG/2ML IJ SOLN: 25.0000 ug | INTRAMUSCULAR | Status: DC | PRN

## 2022-08-11 MED ORDER — ACETAMINOPHEN 500 MG PO TABS: 1000.0000 mg | ORAL_TABLET | Freq: Once | ORAL | Status: DC | PRN

## 2022-08-11 MED ORDER — OXYCODONE HCL 5 MG PO TABS: 5.0000 mg | ORAL_TABLET | Freq: Four times a day (QID) | ORAL | 0 refills | Status: DC | PRN

## 2022-08-11 MED ORDER — PROPOFOL 10 MG/ML IV BOLUS
INTRAVENOUS | Status: DC | PRN
  Administered 2022-08-11: 20 mg via INTRAVENOUS
  Administered 2022-08-11: 160 mg via INTRAVENOUS
  Administered 2022-08-11: 40 mg via INTRAVENOUS

## 2022-08-11 MED ORDER — DEXAMETHASONE SODIUM PHOSPHATE 10 MG/ML IJ SOLN
INTRAMUSCULAR | Status: DC | PRN
  Administered 2022-08-11: 10 mg via INTRAVENOUS

## 2022-08-11 MED ORDER — MIDAZOLAM HCL 5 MG/5ML IJ SOLN
INTRAMUSCULAR | Status: DC | PRN
  Administered 2022-08-11: 2 mg via INTRAVENOUS

## 2022-08-11 MED ORDER — BUPIVACAINE HCL (PF) 0.25 % IJ SOLN
INTRAMUSCULAR | Status: DC | PRN
  Administered 2022-08-11: 20 mL

## 2022-08-11 MED ORDER — FENTANYL CITRATE (PF) 100 MCG/2ML IJ SOLN
INTRAMUSCULAR | Status: DC | PRN
  Administered 2022-08-11: 50 ug via INTRAVENOUS
  Administered 2022-08-11 (×2): 25 ug via INTRAVENOUS

## 2022-08-11 MED ORDER — SODIUM CHLORIDE 0.9 % IV SOLN
INTRAVENOUS | Status: DC | PRN
  Administered 2022-08-11: 500 mL

## 2022-08-11 MED ORDER — LIDOCAINE HCL (CARDIAC) PF 100 MG/5ML IV SOSY
PREFILLED_SYRINGE | INTRAVENOUS | Status: DC | PRN
  Administered 2022-08-11: 60 mg via INTRAVENOUS

## 2022-08-11 MED ORDER — SODIUM CHLORIDE 0.9 % IV SOLN
INTRAVENOUS | Status: AC
  Filled 2022-08-11: qty 10

## 2022-08-11 MED ORDER — OXYCODONE HCL 5 MG/5ML PO SOLN: 5.0000 mg | Freq: Once | ORAL | Status: AC | PRN

## 2022-08-11 MED ORDER — ACETAMINOPHEN 160 MG/5ML PO SOLN: 1000.0000 mg | Freq: Once | ORAL | Status: DC | PRN

## 2022-08-11 MED ORDER — LACTATED RINGERS IV SOLN: INTRAVENOUS | Status: DC

## 2022-08-11 MED ORDER — CEFAZOLIN SODIUM-DEXTROSE 2-4 GM/100ML-% IV SOLN
2.0000 g | INTRAVENOUS | Status: AC
  Administered 2022-08-11: 2 g via INTRAVENOUS

## 2022-08-11 MED ORDER — OXYCODONE HCL 5 MG PO TABS
5.0000 mg | ORAL_TABLET | Freq: Once | ORAL | Status: AC | PRN
  Administered 2022-08-11: 5 mg via ORAL

## 2022-08-11 MED ORDER — ACETAMINOPHEN 500 MG PO TABS
ORAL_TABLET | ORAL | Status: AC
  Filled 2022-08-11: qty 2

## 2022-08-11 MED ORDER — ONDANSETRON HCL 4 MG/2ML IJ SOLN
INTRAMUSCULAR | Status: DC | PRN
  Administered 2022-08-11: 4 mg via INTRAVENOUS

## 2022-08-11 MED ORDER — ACETAMINOPHEN 10 MG/ML IV SOLN
INTRAVENOUS | Status: AC
  Filled 2022-08-11: qty 100

## 2022-08-11 MED ORDER — CEFAZOLIN SODIUM-DEXTROSE 2-4 GM/100ML-% IV SOLN
INTRAVENOUS | Status: AC
  Filled 2022-08-11: qty 100

## 2022-08-11 MED ORDER — ACETAMINOPHEN 500 MG PO TABS: 1000.0000 mg | ORAL_TABLET | ORAL | Status: DC

## 2022-08-11 MED ORDER — ONDANSETRON HCL 4 MG/2ML IJ SOLN
INTRAMUSCULAR | Status: AC
  Filled 2022-08-11: qty 2

## 2022-08-11 MED ORDER — FENTANYL CITRATE (PF) 100 MCG/2ML IJ SOLN
INTRAMUSCULAR | Status: AC
  Filled 2022-08-11: qty 2

## 2022-08-11 MED ORDER — MIDAZOLAM HCL 2 MG/2ML IJ SOLN
INTRAMUSCULAR | Status: AC
  Filled 2022-08-11: qty 2

## 2022-08-11 MED ORDER — OXYCODONE HCL 5 MG PO TABS
ORAL_TABLET | ORAL | Status: AC
  Filled 2022-08-11: qty 1

## 2022-08-11 SURGICAL SUPPLY — 49 items
ADH SKN CLS APL DERMABOND .7 (GAUZE/BANDAGES/DRESSINGS) ×1
APL PRP STRL LF DISP 70% ISPRP (MISCELLANEOUS) ×1
APPLIER CLIP 9.375 MED OPEN (MISCELLANEOUS)
APR CLP MED 9.3 20 MLT OPN (MISCELLANEOUS)
BINDER BREAST LRG (GAUZE/BANDAGES/DRESSINGS) IMPLANT
BINDER BREAST MEDIUM (GAUZE/BANDAGES/DRESSINGS) IMPLANT
BINDER BREAST XLRG (GAUZE/BANDAGES/DRESSINGS) IMPLANT
BINDER BREAST XXLRG (GAUZE/BANDAGES/DRESSINGS) IMPLANT
BLADE SURG 15 STRL LF DISP TIS (BLADE) ×2 IMPLANT
BLADE SURG 15 STRL SS (BLADE) ×1
CANISTER SUC SOCK COL 7IN (MISCELLANEOUS) IMPLANT
CANISTER SUCT 1200ML W/VALVE (MISCELLANEOUS) IMPLANT
CHLORAPREP W/TINT 26 (MISCELLANEOUS) ×2 IMPLANT
CLIP APPLIE 9.375 MED OPEN (MISCELLANEOUS) IMPLANT
COVER BACK TABLE 60X90IN (DRAPES) ×2 IMPLANT
COVER MAYO STAND STRL (DRAPES) ×2 IMPLANT
COVER PROBE CYLINDRICAL 5X96 (MISCELLANEOUS) ×2 IMPLANT
DERMABOND ADVANCED .7 DNX12 (GAUZE/BANDAGES/DRESSINGS) ×2 IMPLANT
DRAPE LAPAROSCOPIC ABDOMINAL (DRAPES) IMPLANT
DRAPE LAPAROTOMY 100X72 PEDS (DRAPES) ×2 IMPLANT
DRAPE UTILITY XL STRL (DRAPES) ×2 IMPLANT
ELECT COATED BLADE 2.86 ST (ELECTRODE) ×2 IMPLANT
ELECT REM PT RETURN 9FT ADLT (ELECTROSURGICAL) ×1
ELECTRODE REM PT RTRN 9FT ADLT (ELECTROSURGICAL) ×2 IMPLANT
GLOVE BIOGEL PI IND STRL 8 (GLOVE) ×2 IMPLANT
GLOVE ECLIPSE 8.0 STRL XLNG CF (GLOVE) ×2 IMPLANT
GOWN STRL REUS W/ TWL LRG LVL3 (GOWN DISPOSABLE) ×4 IMPLANT
GOWN STRL REUS W/ TWL XL LVL3 (GOWN DISPOSABLE) ×2 IMPLANT
GOWN STRL REUS W/TWL LRG LVL3 (GOWN DISPOSABLE) ×2
GOWN STRL REUS W/TWL XL LVL3 (GOWN DISPOSABLE) ×1
HEMOSTAT ARISTA ABSORB 3G PWDR (HEMOSTASIS) IMPLANT
HEMOSTAT SNOW SURGICEL 2X4 (HEMOSTASIS) IMPLANT
KIT MARKER MARGIN INK (KITS) ×2 IMPLANT
NDL HYPO 25X1 1.5 SAFETY (NEEDLE) ×2 IMPLANT
NEEDLE HYPO 25X1 1.5 SAFETY (NEEDLE) ×1 IMPLANT
NS IRRIG 1000ML POUR BTL (IV SOLUTION) ×2 IMPLANT
PACK BASIN DAY SURGERY FS (CUSTOM PROCEDURE TRAY) ×2 IMPLANT
PENCIL SMOKE EVACUATOR (MISCELLANEOUS) ×2 IMPLANT
SLEEVE SCD COMPRESS KNEE MED (STOCKING) ×2 IMPLANT
SPIKE FLUID TRANSFER (MISCELLANEOUS) IMPLANT
SPONGE T-LAP 4X18 ~~LOC~~+RFID (SPONGE) ×2 IMPLANT
SUT MNCRL AB 4-0 PS2 18 (SUTURE) ×2 IMPLANT
SUT SILK 2 0 SH (SUTURE) IMPLANT
SUT VICRYL 3-0 CR8 SH (SUTURE) ×2 IMPLANT
SYR CONTROL 10ML LL (SYRINGE) ×2 IMPLANT
TOWEL GREEN STERILE FF (TOWEL DISPOSABLE) ×2 IMPLANT
TRAY FAXITRON CT DISP (TRAY / TRAY PROCEDURE) ×2 IMPLANT
TUBE CONNECTING 20X1/4 (TUBING) IMPLANT
YANKAUER SUCT BULB TIP NO VENT (SUCTIONS) IMPLANT

## 2022-08-11 NOTE — Discharge Instructions (Addendum)
[facility] Office Phone Number [phone]  BREAST BIOPSY/ PARTIAL MASTECTOMY: POST OP INSTRUCTIONS  Always review your discharge instruction sheet given to you by the facility where your surgery was performed.  IF YOU HAVE DISABILITY OR FAMILY LEAVE FORMS, YOU MUST BRING THEM TO THE OFFICE FOR PROCESSING.  DO NOT GIVE THEM TO YOUR DOCTOR.  A prescription for pain medication may be given to you upon discharge.  Take your pain medication as prescribed, if needed.  If narcotic pain medicine is not needed, then you may take acetaminophen (Tylenol) or ibuprofen (Advil) as needed. Take your usually prescribed medications unless otherwise directed If you need a refill on your pain medication, please contact your pharmacy.  They will contact our office to request authorization.  Prescriptions will not be filled after 5pm or on week-ends. You should eat very light the first 24 hours after surgery, such as soup, crackers, pudding, etc.  Resume your normal diet the day after surgery. Most patients will experience some swelling and bruising in the breast.  Ice packs and a good support bra will help.  Swelling and bruising can take several days to resolve.  It is common to experience some constipation if taking pain medication after surgery.  Increasing fluid intake and taking a stool softener will usually help or prevent this problem from occurring.  A mild laxative (Milk of Magnesia or Miralax) should be taken according to package directions if there are no bowel movements after 48 hours. Unless discharge instructions indicate otherwise, you may remove your bandages 24-48 hours after surgery, and you may shower at that time.  You may have steri-strips (small skin tapes) in place directly over the incision.  These strips should be left on the skin for 7-10 days.  If your surgeon used skin glue on the incision, you may shower in 24 hours.  The glue will flake off over the next 2-3 weeks.  Any  sutures or staples will be removed at the office during your follow-up visit. ACTIVITIES:  You may resume regular daily activities (gradually increasing) beginning the next day.  Wearing a good support bra or sports bra minimizes pain and swelling.  You may have sexual intercourse when it is comfortable. You may drive when you no longer are taking prescription pain medication, you can comfortably wear a seatbelt, and you can safely maneuver your car and apply brakes. RETURN TO WORK:  ______________________________________________________________________________________ You should see your doctor in the office for a follow-up appointment approximately two weeks after your surgery.  Your doctor's nurse will typically make your follow-up appointment when she calls you with your pathology report.  Expect your pathology report 2-3 business days after your surgery.  You may call to check if you do not hear from us after three days. OTHER INSTRUCTIONS: _______________________________________________________________________________________________ _____________________________________________________________________________________________________________________________________ _____________________________________________________________________________________________________________________________________ _____________________________________________________________________________________________________________________________________  WHEN TO CALL YOUR DOCTOR: Fever over 101.0 Nausea and/or vomiting. Extreme swelling or bruising. Continued bleeding from incision. Increased pain, redness, or drainage from the incision.  The clinic staff is available to answer your questions during regular business hours.  Please don't hesitate to call and ask to speak to one of the nurses for clinical concerns.  If you have a medical emergency, go to the nearest emergency room or call 911.  A surgeon from [facility] is always on call at the hospital.  For further questions, please visit [email or web address]    Post Anesthesia Home Care Instructions  Activity: Get plenty of rest for the remainder of   the day. A responsible individual must stay with you for 24 hours following the procedure.  For the next 24 hours, DO NOT: -Drive a car -Operate machinery -Drink alcoholic beverages -Take any medication unless instructed by your physician -Make any legal decisions or sign important papers.  Meals: Start with liquid foods such as gelatin or soup. Progress to regular foods as tolerated. Avoid greasy, spicy, heavy foods. If nausea and/or vomiting occur, drink only clear liquids until the nausea and/or vomiting subsides. Call your physician if vomiting continues.  Special Instructions/Symptoms: Your throat may feel dry or sore from the anesthesia or the breathing tube placed in your throat during surgery. If this causes discomfort, gargle with warm salt water. The discomfort should disappear within 24 hours.  If you had a scopolamine patch placed behind your ear for the management of post- operative nausea and/or vomiting:  1. The medication in the patch is effective for 72 hours, after which it should be removed.  Wrap patch in a tissue and discard in the trash. Wash hands thoroughly with soap and water. 2. You may remove the patch earlier than 72 hours if you experience unpleasant side effects which may include dry mouth, dizziness or visual disturbances. 3. Avoid touching the patch. Wash your hands with soap and water after contact with the patch.      

## 2022-08-11 NOTE — Anesthesia Procedure Notes (Signed)
Procedure Name: LMA Insertion Date/Time: 08/11/2022 4:22 PM  Performed by: Lavonia Dana, CRNAPre-anesthesia Checklist: Patient identified, Emergency Drugs available, Suction available and Patient being monitored Patient Re-evaluated:Patient Re-evaluated prior to induction Oxygen Delivery Method: Circle system utilized Preoxygenation: Pre-oxygenation with 100% oxygen Induction Type: IV induction Ventilation: Mask ventilation without difficulty LMA: LMA inserted LMA Size: 4.0 Number of attempts: 1 Airway Equipment and Method: Bite block Placement Confirmation: positive ETCO2 Tube secured with: Tape Dental Injury: Teeth and Oropharynx as per pre-operative assessment

## 2022-08-11 NOTE — Op Note (Signed)
Preoperative diagnosis: Right breast DCIS upper outer quadrant  Postoperative diagnosis: Same  Procedure: Right breast seed localized lumpectomy  Surgeon: Erroll Luna, MD  Anesthesia: General with 0.25% Marcaine plain  EBL: Minimal  Specimen: Right breast tissue.  All margins around this were excised since the seed and clip were separated.  Clip was not identified but area widely excised and seed image sent separately.  All margins were oriented with ink.  Drains: None  IV fluids: Per anesthesia record  Indications for procedure: The patient is a 52 year old female with right breast DCIS diagnosed by mammogram core biopsy.  She presents today for lumpectomy.  Risk benefits and alternative therapies were reviewed with the patient as well as being seen by the multidisciplinary team.  We discussed mastectomy reconstruction and lumpectomy.  Of note she does have subpectoral implants.  We discussed potential injury to these and rupture.  We discussed how to take tissue off the implant to excise the area and its entirety.  We also discussed possible implant loss with the procedure.  She understood the above and wished to proceed.  Description of procedure: The patient was met in the holding area and questions were answered.  The right breast was marked as the correct site.  Films were available for review.  She was then taken back to the operative room.  She was placed upon upon the OR table.  After induction of general esthesia, the right breast was prepped draped in sterile fashion and a timeout was performed.  Proper patient, site and procedure were verified.  Neoprobe used to identify the seed right breast upper outer quadrant.  A curvilinear incision was made over the signal.  We created skin flaps and she had very little breast tissue due to her implants.  I identified the area of the seed and the seed could be seen.  This area was excised after remove the seed separately since it was quite  superficial.  I took this large area out all the way down to the capsule of the implant.  Imaging did not reveal the clip to be in this.  I then excised all margins around this widely and the clip again was not identified.  I suspect it was dislodged with the tissue manipulation since her breast tissue was quite dense and difficult to manipulate.  All bleeding was controlled cautery and small clips.  All margins were oriented with ink around the central lumpectomy cavity.  Local anesthetic was infiltrated carefully.  I did take a deep margin with which was part of the capsule.  I then closed the capsule carefully of the implant with 3-0 Vicryl making sure not to injure the implant.  Deep tissue layers were then closed with 3-0 Vicryl as well carefully.  4 Monocryl was used to close the skin.  Of note I did put local anesthetic throughout the cavity prior to closing.  Once the skin was closed Dermabond applied.  Breast binder placed.  All counts were found to be correct.  The patient was then awoke extubated taken recovery in satisfactory condition.

## 2022-08-11 NOTE — Transfer of Care (Signed)
Immediate Anesthesia Transfer of Care Note  Patient: Samantha Mcdowell  Procedure(s) Performed: RIGHT BREAST LUMPECTOMY WITH RADIOACTIVE SEED LOCALIZATION (Right: Breast)  Patient Location: PACU  Anesthesia Type:General  Level of Consciousness: sedated  Airway & Oxygen Therapy: Patient Spontanous Breathing and Patient connected to face mask oxygen  Post-op Assessment: Report given to RN and Post -op Vital signs reviewed and stable  Post vital signs: Reviewed and stable  Last Vitals:  Vitals Value Taken Time  BP    Temp    Pulse    Resp    SpO2      Last Pain:  Vitals:   08/11/22 1338  TempSrc: Oral  PainSc: 0-No pain         Complications: No notable events documented.

## 2022-08-11 NOTE — Telephone Encounter (Signed)
Returned phone call from patient. No answer. Left VM for a return phone call.

## 2022-08-11 NOTE — Interval H&P Note (Signed)
History and Physical Interval Note:  08/11/2022 3:54 PM  Samantha Mcdowell  has presented today for surgery, with the diagnosis of RIGHT BREAST DUCTAL CARCINOMA IN SITU.  The various methods of treatment have been discussed with the patient and family. After consideration of risks, benefits and other options for treatment, the patient has consented to  Procedure(s): RIGHT BREAST LUMPECTOMY WITH RADIOACTIVE SEED LOCALIZATION (Right) as a surgical intervention.  The patient's history has been reviewed, patient examined, no change in status, stable for surgery.  I have reviewed the patient's chart and labs.  Questions were answered to the patient's satisfaction.     [place]

## 2022-08-11 NOTE — H&P (Signed)
History of Present Illness: Samantha Mcdowell is a 52 y.o. female who is seen today as an office consultation for evaluation of Breast Cancer .  Patient presents for evaluation of abnormal right breast mammogram. A density was noted in the upper outer quadrant core biopsy proven to be DCIS. This is ER/PR positive. Area measures roughly 1.3 cm distortion. She does have an implant which is retropectoral. No history except uterine cancer of family cancers. No breast cancer history noted. Gets her mammograms every year. Non-smoker.  Review of Systems: A complete review of systems was obtained from the patient. I have reviewed this information and discussed as appropriate with the patient. See HPI as well for other ROS.    Medical History: Past Medical History: Diagnosis Date History of cancer  Patient Active Problem List Diagnosis Ductal carcinoma in situ (DCIS) of right breast Family history of melanoma  Past Surgical History: Procedure Laterality Date Dilation and Curettage of uterus N/A Date Unknown   Allergies Allergen Reactions Erythromycin Base Nausea And Vomiting  Current Outpatient Medications on File Prior to Visit Medication Sig Dispense Refill sertraline (ZOLOFT) 50 MG tablet Take 50 mg by mouth once daily Two tablets daily  No current facility-administered medications on file prior to visit.  No family history on file.  Social History  Tobacco Use Smoking Status Never Smokeless Tobacco Never   Social History  Socioeconomic History Marital status: Married Tobacco Use Smoking status: Never Smokeless tobacco: Never  Objective: There were no vitals filed for this visit. There is no height or weight on file to calculate BMI.  Physical Exam Exam conducted with a chaperone present. HENT: Head: Normocephalic. Eyes: Pupils: Pupils are equal, round, and reactive to light. Cardiovascular: Rate and Rhythm: Normal rate. Pulmonary: Effort: Pulmonary effort is  normal. Chest: Breasts: Right: Swelling and tenderness present. Left: Normal. Musculoskeletal: General: Normal range of motion. Cervical back: Normal range of motion. Lymphadenopathy: Upper Body: Right upper body: No axillary or pectoral adenopathy. Left upper body: No axillary or pectoral adenopathy. Skin: General: Skin is warm. Neurological: General: No focal deficit present. Mental Status: She is alert. Psychiatric: Mood and Affect: Mood normal.    Labs, Imaging and Diagnostic Testing: Mammogram reveals 1.3 cm area of density right upper outer quadrant. Core biopsy shows intermediate grade DCIS ER positive PR positive  Assessment and Plan:  Diagnoses and all orders for this visit:  Ductal carcinoma in situ (DCIS) of right breast  Discussed breast conserving surgery with mastectomy with reconstruction. Discussed potential impact with her implants of surgery and radiation therapy. Discussed potential damage to the implant needing we will. Discussed expected cosmesis of the procedure and outcome. She is opted for right breast seed localized lumpectomy. Risks and benefits of surgery reviewed as well as complications of bleeding, infection, injury to implant, injury to neighboring structures, and anesthesia risk and the need for recurrent and/or excisional surgery. Long-term recovery, quality life and alternative treatment options discussed.  No follow-ups on file.  Kennieth Francois, MD

## 2022-08-11 NOTE — Anesthesia Preprocedure Evaluation (Signed)
Anesthesia Evaluation  Patient identified by MRN, date of birth, ID band Patient awake    Reviewed: Allergy & Precautions, NPO status , Patient's Chart, lab work & pertinent test results  History of Anesthesia Complications Negative for: history of anesthetic complications  Airway Mallampati: II  TM Distance: >3 FB Neck ROM: Full    Dental  (+) Teeth Intact, Dental Advisory Given   Pulmonary neg pulmonary ROS   breath sounds clear to auscultation       Cardiovascular negative cardio ROS  Rhythm:Regular     Neuro/Psych  PSYCHIATRIC DISORDERS  Depression    negative neurological ROS     GI/Hepatic negative GI ROS, Neg liver ROS,,,  Endo/Other  negative endocrine ROS    Renal/GU negative Renal ROS     Musculoskeletal negative musculoskeletal ROS (+)    Abdominal   Peds  Hematology negative hematology ROS (+)   Anesthesia Other Findings   Reproductive/Obstetrics                             Anesthesia Physical Anesthesia Plan  ASA: 1  Anesthesia Plan: General   Post-op Pain Management: Toradol IV (intra-op)* and Ofirmev IV (intra-op)*   Induction: Intravenous  PONV Risk Score and Plan: 3 and Ondansetron, Dexamethasone, Propofol infusion and TIVA  Airway Management Planned: LMA  Additional Equipment: None  Intra-op Plan:   Post-operative Plan: Extubation in OR  Informed Consent: I have reviewed the patients History and Physical, chart, labs and discussed the procedure including the risks, benefits and alternatives for the proposed anesthesia with the patient or authorized representative who has indicated his/her understanding and acceptance.     Dental advisory given  Plan Discussed with: CRNA  Anesthesia Plan Comments:        Anesthesia Quick Evaluation

## 2022-08-12 ENCOUNTER — Encounter (HOSPITAL_BASED_OUTPATIENT_CLINIC_OR_DEPARTMENT_OTHER): Payer: Self-pay | Admitting: Surgery

## 2022-08-14 LAB — SURGICAL PATHOLOGY

## 2022-08-17 ENCOUNTER — Encounter: Payer: Self-pay | Admitting: *Deleted

## 2022-08-17 ENCOUNTER — Ambulatory Visit: Payer: Self-pay | Admitting: Genetic Counselor

## 2022-08-17 ENCOUNTER — Encounter: Payer: Self-pay | Admitting: Surgery

## 2022-08-17 DIAGNOSIS — Z1379 Encounter for other screening for genetic and chromosomal anomalies: Secondary | ICD-10-CM

## 2022-08-17 NOTE — Progress Notes (Signed)
HPI:   Samantha Mcdowell was previously seen in the [facility] due to a personal and family history of cancer and concerns regarding a hereditary predisposition to cancer. Please refer to our prior cancer genetics clinic note for more information regarding our discussion, assessment and recommendations, at the time. Samantha Mcdowell's recent genetic test results were disclosed to her, as were recommendations warranted by these results. These results and recommendations are discussed in more detail below.  CANCER HISTORY:  Oncology History  Ductal carcinoma in situ (DCIS) of right breast  07/15/2022 Initial Diagnosis   Focal pain in bilateral breasts. mammogram detected Occasions and asymmetry in the right breast upper outer quadrant (implants).  No ultrasound correlate.  Biopsy revealed high-grade DCIS with necrosis ER 40% PR 2%   07/22/2022 Cancer Staging   Staging form: Breast, AJCC 8th Edition - Clinical: Stage 0 (cTis (DCIS), cN0, cM0, ER+, PR+, HER2: Not Assessed) - Signed by Nicholas Lose, MD on 07/22/2022 Stage prefix: Initial diagnosis Nuclear grade: G3    Genetic Testing   [facility] CancerNext-Expanded Panel+RNA was Negative. 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 Report date is 07/29/2022.  The CancerNext-Expanded gene panel offered by [facility] and includes sequencing, rearrangement, and RNA analysis for the following 77 genes: AIP, ALK, APC, ATM, AXIN2, BAP1, BARD1, BLM, BMPR1A, BRCA1, BRCA2, BRIP1, CDC73, CDH1, CDK4, CDKN1B, CDKN2A, CHEK2, CTNNA1, DICER1, FANCC, FH, FLCN, GALNT12, KIF1B, LZTR1, MAX, MEN1, MET, MLH1, MSH2, MSH3, MSH6, MUTYH, NBN, NF1, NF2, NTHL1, PALB2, PHOX2B, PMS2, POT1, PRKAR1A, PTCH1, PTEN, RAD51C, RAD51D, RB1, RECQL, RET, SDHA, SDHAF2, SDHB, SDHC, SDHD, SMAD4, SMARCA4, SMARCB1, SMARCE1, STK11, SUFU, TMEM127, TP53, TSC1, TSC2, VHL and XRCC2 (sequencing and deletion/duplication); EGFR, EGLN1, HOXB13, KIT, MITF,  PDGFRA, POLD1, and POLE (sequencing only); EPCAM and GREM1 (deletion/duplication only).      FAMILY HISTORY:  We obtained a detailed, 4-generation family history.  Significant diagnoses are listed below:      Family History  Problem Relation Age of Onset   Uterine cancer Mother 59   Melanoma Father 32        metastatic, agent orange   Lung cancer Paternal Grandfather          smoked       Samantha Mcdowell's mother was diagnosed with uterine cancer at age 53. Her father was diagnosed with metastatic melanoma at age 8, he was exposed to agent orange and died at age 62. Her paternal grandfather was diagnosed with lung cancer, he smoked and is deceased. Samantha Mcdowell is unaware of previous family history of genetic testing for hereditary cancer risks. There is no reported Ashkenazi Jewish ancestry.   GENETIC TEST RESULTS:  The [facility] CancerNext-Expanded Panel found no pathogenic mutations.   The CancerNext-Expanded gene panel offered by [facility] and includes sequencing, rearrangement, and RNA analysis for the following 77 genes: AIP, ALK, APC, ATM, AXIN2, BAP1, BARD1, BLM, BMPR1A, BRCA1, BRCA2, BRIP1, CDC73, CDH1, CDK4, CDKN1B, CDKN2A, CHEK2, CTNNA1, DICER1, FANCC, FH, FLCN, GALNT12, KIF1B, LZTR1, MAX, MEN1, MET, MLH1, MSH2, MSH3, MSH6, MUTYH, NBN, NF1, NF2, NTHL1, PALB2, PHOX2B, PMS2, POT1, PRKAR1A, PTCH1, PTEN, RAD51C, RAD51D, RB1, RECQL, RET, SDHA, SDHAF2, SDHB, SDHC, SDHD, SMAD4, SMARCA4, SMARCB1, SMARCE1, STK11, SUFU, TMEM127, TP53, TSC1, TSC2, VHL and XRCC2 (sequencing and deletion/duplication); EGFR, EGLN1, HOXB13, KIT, MITF, PDGFRA, POLD1, and POLE (sequencing only); EPCAM and GREM1 (deletion/duplication only).   The test report has been scanned into EPIC and is located under the Molecular Pathology section of  the Results Review tab.  A portion of the result report is included below for reference. Genetic testing reported out on 07/29/2022.        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  At this time, it is unknown if this variant is associated with an increased risk for cancer or if it is benign, but most uncertain variants are reclassified to benign. It should not be used to make medical management decisions. With time, we suspect the laboratory will determine the significance of this variant, if any. If the laboratory reclassifies this variant, we will attempt to contact Samantha Mcdowell to discuss it further.   Even though a pathogenic variant was not identified, possible explanations for the cancer in the family may include: There may be no hereditary risk for cancer in the family. The cancers in Samantha Mcdowell and/or her family may be due to other genetic or environmental factors. There may be a gene mutation in one of these genes that current testing methods cannot detect, but that chance is small. There could be another gene that has not yet been discovered, or that we have not yet tested, that is responsible for the cancer diagnoses in the family.   Therefore, it is important to remain in touch with cancer genetics in the future so that we can continue to offer Samantha Mcdowell the most up to date genetic testing.   ADDITIONAL GENETIC TESTING:  We discussed with Samantha Mcdowell that her genetic testing was fairly extensive.  If there are genes identified to increase cancer risk that can be analyzed in the future, we would be happy to discuss and coordinate this testing at that time.    CANCER SCREENING RECOMMENDATIONS:  Samantha Mcdowell test result is considered negative (normal).  This means that we have not identified a hereditary cause for her personal and family history of cancer at this time.   An individual's cancer risk and medical management are not determined by genetic test results alone. Overall cancer risk assessment incorporates additional factors, including personal medical history, family history, and any available  genetic information that may result in a personalized plan for cancer prevention and surveillance. Therefore, it is recommended she continue to follow the cancer management and screening guidelines provided by her oncology and primary healthcare provider.  RECOMMENDATIONS FOR FAMILY MEMBERS:   Since she did not inherit a mutation in a cancer predisposition gene included on this panel, her children could not have inherited a mutation from her in one of these genes. Individuals in this family might be at some increased risk of developing cancer, over the general population risk, due to the family history of cancer. We recommend women in this family have a yearly mammogram beginning at age 15, or 54 years younger than the earliest onset of cancer, an annual clinical breast exam, and perform monthly breast self-exams. We do not recommend familial testing for the RB1 variant of uncertain significance (VUS).  FOLLOW-UP:  Cancer genetics is a rapidly advancing field and it is possible that new genetic tests will be appropriate for her and/or her family members in the future. We encouraged her to remain in contact with cancer genetics on an annual basis so we can update her personal and family histories and let her know of advances in cancer genetics that may benefit this family.   Our contact number was provided. Ms. Bustamante's questions were answered to her satisfaction, and she knows she  is welcome to call us at anytime with additional questions or concerns.   Samantha Passy, MS, [facility] Genetic Counselor [email or web address] ([phone]) [phone]

## 2022-08-17 NOTE — Anesthesia Postprocedure Evaluation (Signed)
Anesthesia Post Note  Patient: Samantha Mcdowell  Procedure(s) Performed: RIGHT BREAST LUMPECTOMY WITH RADIOACTIVE SEED LOCALIZATION (Right: Breast)     Patient location during evaluation: PACU Anesthesia Type: General Level of consciousness: awake and alert Pain management: pain level controlled Vital Signs Assessment: post-procedure vital signs reviewed and stable Respiratory status: spontaneous breathing, nonlabored ventilation, respiratory function stable and patient connected to nasal cannula oxygen Cardiovascular status: blood pressure returned to baseline and stable Postop Assessment: no apparent nausea or vomiting Anesthetic complications: no   No notable events documented.  Last Vitals:  Vitals:   08/11/22 1800 08/11/22 1822  BP: 126/76 122/84  Pulse: 73 78  Resp: 16 18  Temp:  (!) 36.1 C  SpO2: 95% 99%    Last Pain:  Vitals:   08/12/22 1451  TempSrc:   PainSc: 3                  Tait Balistreri

## 2022-08-25 ENCOUNTER — Ambulatory Visit: Payer: BC Managed Care – PPO | Admitting: Hematology and Oncology

## 2022-08-25 ENCOUNTER — Inpatient Hospital Stay: Payer: BC Managed Care – PPO | Attending: Hematology and Oncology | Admitting: Hematology and Oncology

## 2022-08-25 ENCOUNTER — Other Ambulatory Visit: Payer: Self-pay

## 2022-08-25 VITALS — BP 115/75 | HR 68 | Temp 97.9°F | Resp 16 | Wt 153.0 lb

## 2022-08-25 DIAGNOSIS — D0511 Intraductal carcinoma in situ of right breast: Secondary | ICD-10-CM | POA: Diagnosis present

## 2022-08-25 NOTE — Progress Notes (Signed)
Patient Care Team: Servando Salina, MD as PCP - General (Obstetrics and Gynecology) Mauro Kaufmann, RN as Oncology Nurse Navigator Carlynn Spry, Charlott Holler, RN as Oncology Nurse Navigator Nicholas Lose, MD as Consulting Physician (Hematology and Oncology) Gery Pray, MD as Consulting Physician (Radiation Oncology) Erroll Luna, MD as Consulting Physician (General Surgery)  DIAGNOSIS:  Encounter Diagnosis  Name Primary?   Ductal carcinoma in situ (DCIS) of right breast Yes    SUMMARY OF ONCOLOGIC HISTORY: Oncology History  Ductal carcinoma in situ (DCIS) of right breast  07/15/2022 Initial Diagnosis   Focal pain in bilateral breasts. mammogram detected Occasions and asymmetry in the right breast upper outer quadrant (implants).  No ultrasound correlate.  Biopsy revealed high-grade DCIS with necrosis ER 40% PR 2%   07/22/2022 Cancer Staging   Staging form: Breast, AJCC 8th Edition - Clinical: Stage 0 (cTis (DCIS), cN0, cM0, ER+, PR+, HER2: Not Assessed) - Signed by Nicholas Lose, MD on 07/22/2022 Stage prefix: Initial diagnosis Nuclear grade: G3    Genetic Testing   [facility] CancerNext-Expanded Panel+RNA was Negative. 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 Report date is 07/29/2022.  The CancerNext-Expanded gene panel offered by [facility] and includes sequencing, rearrangement, and RNA analysis for the following 77 genes: AIP, ALK, APC, ATM, AXIN2, BAP1, BARD1, BLM, BMPR1A, BRCA1, BRCA2, BRIP1, CDC73, CDH1, CDK4, CDKN1B, CDKN2A, CHEK2, CTNNA1, DICER1, FANCC, FH, FLCN, GALNT12, KIF1B, LZTR1, MAX, MEN1, MET, MLH1, MSH2, MSH3, MSH6, MUTYH, NBN, NF1, NF2, NTHL1, PALB2, PHOX2B, PMS2, POT1, PRKAR1A, PTCH1, PTEN, RAD51C, RAD51D, RB1, RECQL, RET, SDHA, SDHAF2, SDHB, SDHC, SDHD, SMAD4, SMARCA4, SMARCB1, SMARCE1, STK11, SUFU, TMEM127, TP53, TSC1, TSC2, VHL and XRCC2 (sequencing and deletion/duplication); EGFR, EGLN1, HOXB13, KIT, MITF, PDGFRA, POLD1,  and POLE (sequencing only); EPCAM and GREM1 (deletion/duplication only).    08/11/2022 Surgery   Right lumpectomy: DCIS 1.2 cm, margins negative (excision of the medial and superior margins had more of DCIS but final margins are negative) incidental papilloma, ER 40% weak to moderate, PR 2% moderate to strong     CHIEF COMPLIANT: Follow-up post op  INTERVAL HISTORY: Samantha Mcdowell is a 52 y.o. female is here because of recent diagnosis of right breast DCIS. She presents to the clinic for a follow-up after surgery. She states that surgery went well. She is recovering. She denies any pain or discomfort. She had some concerns about her incision site. She feels like it is infected because she says she kind of pulled it. She reports that the bra sticks to it, causing some discomfort. She complains of hot flashes.    ALLERGIES:  is allergic to erythromycin.  MEDICATIONS:  Current Outpatient Medications  Medication Sig Dispense Refill   BuPROPion HCl (WELLBUTRIN XL PO) Take by mouth.     Sertraline HCl (ZOLOFT PO) Take by mouth.     No current facility-administered medications for this visit.    PHYSICAL EXAMINATION: ECOG PERFORMANCE STATUS: 1 - Symptomatic but completely ambulatory  Vitals:   08/25/22 1530  BP: 115/75  Pulse: 68  Resp: 16  Temp: 97.9 F (36.6 C)  SpO2: 99%   Filed Weights   08/25/22 1530  Weight: 153 lb (69.4 kg)      LABORATORY DATA:  I have reviewed the data as listed    Latest Ref Rng & Units 07/22/2022   12:19 PM  CMP  Glucose 70 - 99 mg/dL 86   BUN 6 - 20 mg/dL 17   Creatinine 0.44 -  1.00 mg/dL 0.85   Sodium 135 - 145 mmol/L 140   Potassium 3.5 - 5.1 mmol/L 3.7   Chloride 98 - 111 mmol/L 105   CO2 22 - 32 mmol/L 32   Calcium 8.9 - 10.3 mg/dL 9.3   Total Protein 6.5 - 8.1 g/dL 7.1   Total Bilirubin 0.3 - 1.2 mg/dL 0.3   Alkaline Phos 38 - 126 U/L 126   AST 15 - 41 U/L 26   ALT 0 - 44 U/L 35     Lab Results  Component Value Date   WBC 5.5  07/22/2022   HGB 13.8 07/22/2022   HCT 40.7 07/22/2022   MCV 88.7 07/22/2022   PLT 301 07/22/2022   NEUTROABS 2.6 07/22/2022    ASSESSMENT & PLAN:  Ductal carcinoma in situ (DCIS) of right breast 08/11/2022:Right lumpectomy: DCIS 1.2 cm, margins negative (excision of the medial and superior margins had more of DCIS but final margins are negative) incidental papilloma, ER 40% weak to moderate, PR 2% moderate to strong  Pathology counseling: I discussed the final pathology report of the patient provided  a copy of this report. I discussed the margins.  We also discussed the final staging along with previously performed ER/PR testing.  Treatment plan: adjuvant radiation therapy Followed by antiestrogen therapy with tamoxifen 5 years (patient is very concerned about the hot flashes since she has existing severe hot flashes).  We may consider anastrozole instead of tamoxifen because she is on sertraline and Wellbutrin  Return to clinic after radiation is complete   No orders of the defined types were placed in this encounter.  The patient has a good understanding of the overall plan. she agrees with it. she will call with any problems that may develop before the next visit here. Total time spent: 30 mins including face to face time and time spent for planning, charting and co-ordination of care   Harriette Ohara, MD 08/25/22   I Gardiner Coins am acting as a Education administrator for [employer]  I have reviewed the above documentation for accuracy and completeness, and I agree with the above.

## 2022-08-25 NOTE — Assessment & Plan Note (Signed)
08/11/2022:Right lumpectomy: DCIS 1.2 cm, margins negative (excision of the medial and superior margins had more of DCIS but final margins are negative) incidental papilloma, ER 40% weak to moderate, PR 2% moderate to strong  Pathology counseling: I discussed the final pathology report of the patient provided  a copy of this report. I discussed the margins.  We also discussed the final staging along with previously performed ER/PR testing.  Treatment plan: adjuvant radiation therapy Followed by antiestrogen therapy with tamoxifen 5 years  Return to clinic after radiation is complete

## 2022-09-01 ENCOUNTER — Encounter (HOSPITAL_COMMUNITY): Payer: Self-pay

## 2022-09-08 NOTE — Progress Notes (Signed)
Radiation Oncology         [phone] ________________________________  Name: Samantha Mcdowell MRN: [id number]  Date: 09/09/2022  DOB: 1970/09/14  Re-Evaluation Note  CC: Servando Salina, MD  Nicholas Lose, MD    ICD-10-CM   1. Ductal carcinoma in situ (DCIS) of right breast  D05.11       Diagnosis:  Stage 0 (cTis (DCIS), cN0, cM0) Right Breast, Intermediate to high-grade ductal carcinoma in-situ, ER+ / PR+ / Her2 not assessed: s/p lumpectomy   Narrative:  The patient returns today to discuss radiation treatment options. She was seen in the multidisciplinary breast clinic on 07/22/22.   On her consultation date, she opted to pursue genetic testing. Results showed no clinically significant variants detected by BRCAplus or +RNAinsight testing. However, a variant on unknown significance was detected in the RB1 gene.   Since her consultation date, she opted to proceed with a right breast lumpectomy without nodal biopsies on 08/11/22 under the care of Dr. Brantley Stage. Pathology from the procedure revealed: tumor the size of 12 mm; histology of intermediate to high-grade DCIS; all final margins negative for DCIS; margin status to in situ disease of 3 mm from the final medial margin and 6 mm from the final superior margin; no lymph nodes were examined. Prognostic indicators significant for: estrogen receptor 40% positive with weak to moderate staining intensity; progesterone receptor 2% positive with  moderate to strong staining intensity; Her2 not assessed.    The patient has met with Dr. Lindi Adie and is agreeable to proceed with antiestrogen therapy following XRT. She was initially recommended tamoxifen, however the patient has expressed concern about tamoxifen exacerbating her already existing severe hot flashes. Dr. Lindi Adie may consider anastrozole instead of tamoxifen given that she is on sertraline and Wellbutrin. The patient will return to Dr. Lindi Adie following XRT to make a final decision  regarding adjuvant hormonal therapy.    Patient is here with her supportive husband today. On review of systems, the patient reports to be doing well since the surgery. She does however, feel like one of her stitches is coming out. She tried pulling it out herself but it had started to bleed so she stopped. She reports using hydrogen peroxide spray twice a day on her incision to prevent infection. She denies breast pain or swelling, arm swelling and any other symptoms.    Allergies:  is allergic to erythromycin.  Meds: Current Outpatient Medications  Medication Sig Dispense Refill   BuPROPion HCl (WELLBUTRIN XL PO) Take by mouth.     Sertraline HCl (ZOLOFT PO) Take by mouth.     No current facility-administered medications for this encounter.    Physical Findings: The patient is in no acute distress. Patient is alert and oriented.  height is '5\' 6"'$  (1.676 m) and weight is 150 lb 12.8 oz (68.4 kg). Her temperature is 97.6 F (36.4 C). Her blood pressure is 95/60 and her pulse is 69. Her respiration is 18 and oxygen saturation is 99%.   Lungs are clear to auscultation bilaterally. Heart has regular rate and rhythm. No palpable cervical, supraclavicular, or axillary adenopathy. Abdomen soft, non-tender, normal bowel sounds. Left Breast: Implant in place. No palpable mass, nipple discharge or bleeding. Right Breast: Well healing surgical incision covered with Dermabond. Anchor of stitch revealed at lateral end of incision. No signs of infection present. Implant in place. No other abnormalities appreciated in breast or axilla.   Lab Findings: Lab Results  Component Value Date   WBC 5.5  07/22/2022   HGB 13.8 07/22/2022   HCT 40.7 07/22/2022   MCV 88.7 07/22/2022   PLT 301 07/22/2022    Radiographic Findings: I personally reviewed the radiographic findings  Impression:  Stage 0 (cTis (DCIS), cN0, cM0) Right Breast, Intermediate to high-grade ductal carcinoma in-situ, ER+ / PR+ / Her2 not  assessed: s/p lumpectomy    Patient is recovering well from her lumpectomy on 08/11/22. Radiation treatment is indicated at this time to prevent risk of local disease recurrence. Today we discussed the natural course of DCIS. We highlighted the role of radiotherapy in the management and focused on the details of logistics and delivery. We reviewed the anticipated acute and late sequelae associated with radiation in this setting. Patient had breast implants placed in 2018. We discussed the potential risk of capsular contraction after radiation therapy.  In rare situations this can result in chronic pain requiring the implant to be removed.We will give her the standard fractionation 6 weeks of radiation therapy to hopefully decrease her risk of capsular contracture. The patient was encouraged to ask questions that I answered to the best of my ability. Patient expressed readiness to proceed with treatment. A consent form was reviewed and signed today.    Plan:  Patient is scheduled for CT simulation later today. Will plan to begin radiation approximately 1-2 weeks after simulation. Plan to deliver 50.4 Gy in 28 fractions to the right breast followed by a boost of 12 Gy in 6 fractions to the lumpectomy cavity.   -----------------------------------   Leona Singleton, PA   Blair Promise, PhD, MD  This document serves as a record of services personally performed by Gery Pray, MD. It was created on his behalf by Roney Mans, a trained medical scribe. The creation of this record is based on the scribe's personal observations and the provider's statements to them. This document has been checked and approved by the attending provider.

## 2022-09-08 NOTE — Progress Notes (Signed)
Location of Breast Cancer: Ductal carcinoma in situ (DCIS) of right breast   Histology per Pathology Report:   FINAL MICROSCOPIC DIAGNOSIS:  A. BREAST, RIGHT, LUMPECTOMY: -  Ductal carcinoma in situ (DCIS with extensive mechanical/thermal artifact, difficult to grade (interpreted as 2-3/3; original biopsy interpreted as high-grade). -  Biopsy site changes present. -  Present in 4 of 9 blocks interpreted as an overall size of 12 mm. -  Superior and medial margins positive (in conjunction with parts B and C margins negative with new medial margin interpreted as 3 mm and new superior margin interpreted as 6 mm); anterior margin at 1 mm (in conjunction with part F new anterior margin 8 mm); all other margins >10 mm.  B. BREAST, RIGHT MEDIAL MARGIN, EXCISION: -  DCIS with extensive mechanical/thermal artifact, 3 mm from yellow unoriented inked margin (interpreted as new margin).  C. BREAST, RIGHT SUPERIOR MARGIN, EXCISION: -  DCIS with extensive mechanical/thermal artifact (calcifications present), 6 mm from red unoriented inked margin (interpreted as new margin)  D. BREAST, RIGHT INFERIOR MARGIN, EXCISION: -  Benign breast tissue.  E. BREAST, RIGHT LATERAL MARGIN, EXCISION: -  Benign breast tissue.  F. BREAST, RIGHT ANTERIOR MARGIN, EXCISION: -  Benign breast tissue with an incidental papilloma (new thickness 7 mm).  G. BREAST, RIGHT POSTERIOR MARGIN, EXCISION: -  Benign breast tissue.    Receptor Status:  Estrogen Receptor: Positive, 40% weak to moderate       Progesterone Receptor: Positive, 2% moderate to strong   Did patient present with symptoms (if so, please note symptoms) or was this found on screening mammography?: routine mammogram  Past/Anticipated interventions by surgeon, if any:  Preoperative diagnosis: Right breast DCIS upper outer quadrant   Postoperative diagnosis: Same   Procedure: Right breast seed localized lumpectomy   Surgeon: Erroll Luna,  MD    08-11-22  Past/Anticipated interventions by medical oncology, if any:  SUMMARY OF ONCOLOGIC HISTORY:    Oncology History  Ductal carcinoma in situ (DCIS) of right breast  07/15/2022 Initial Diagnosis    Focal pain in bilateral breasts. mammogram detected Occasions and asymmetry in the right breast upper outer quadrant (implants).  No ultrasound correlate.  Biopsy revealed high-grade DCIS with necrosis ER 40% PR 2%    07/22/2022 Cancer Staging    Staging form: Breast, AJCC 8th Edition - Clinical: Stage 0 (cTis (DCIS), cN0, cM0, ER+, PR+, HER2: Not Assessed) - Signed by Nicholas Lose, MD on 07/22/2022 Stage prefix: Initial diagnosis Nuclear grade: G3      Genetic Testing    [facility] CancerNext-Expanded Panel+RNA was Negative. 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 Report date is 07/29/2022.   The CancerNext-Expanded gene panel offered by [facility] and includes sequencing, rearrangement, and RNA analysis for the following 77 genes: AIP, ALK, APC, ATM, AXIN2, BAP1, BARD1, BLM, BMPR1A, BRCA1, BRCA2, BRIP1, CDC73, CDH1, CDK4, CDKN1B, CDKN2A, CHEK2, CTNNA1, DICER1, FANCC, FH, FLCN, GALNT12, KIF1B, LZTR1, MAX, MEN1, MET, MLH1, MSH2, MSH3, MSH6, MUTYH, NBN, NF1, NF2, NTHL1, PALB2, PHOX2B, PMS2, POT1, PRKAR1A, PTCH1, PTEN, RAD51C, RAD51D, RB1, RECQL, RET, SDHA, SDHAF2, SDHB, SDHC, SDHD, SMAD4, SMARCA4, SMARCB1, SMARCE1, STK11, SUFU, TMEM127, TP53, TSC1, TSC2, VHL and XRCC2 (sequencing and deletion/duplication); EGFR, EGLN1, HOXB13, KIT, MITF, PDGFRA, POLD1, and POLE (sequencing only); EPCAM and GREM1 (deletion/duplication only).     08/11/2022 Surgery    Right lumpectomy: DCIS 1.2 cm, margins negative (excision of the medial and superior margins had more of DCIS but final  margins are negative) incidental papilloma, ER 40% weak to moderate, PR 2% moderate to strong     ASSESSMENT & PLAN:  Ductal carcinoma in situ (DCIS) of right breast 08/11/2022:Right  lumpectomy: DCIS 1.2 cm, margins negative (excision of the medial and superior margins had more of DCIS but final margins are negative) incidental papilloma, ER 40% weak to moderate, PR 2% moderate to strong   Pathology counseling: I discussed the final pathology report of the patient provided  a copy of this report. I discussed the margins.  We also discussed the final staging along with previously performed ER/PR testing.   Treatment plan: adjuvant radiation therapy Followed by antiestrogen therapy with tamoxifen 5 years (patient is very concerned about the hot flashes since she has existing severe hot flashes).  We may consider anastrozole instead of tamoxifen because she is on sertraline and Wellbutrin   Return to clinic after radiation is complete     No orders of the defined types were placed in this encounter.   The patient has a good understanding of the overall plan. she agrees with it. she will call with any problems that may develop before the next visit here. Total time spent: 30 mins including face to face time and time spent for planning, charting and co-ordination of care    Harriette Ohara, MD 08/25/22  Lymphedema issues, if any: No     Pain issues, if any: No    SAFETY ISSUES: Prior radiation? No Pacemaker/ICD? No Possible current pregnancy?Postmenopausal Is the patient on methotrexate? No  Current Complaints / other details:

## 2022-09-09 ENCOUNTER — Ambulatory Visit
Admission: RE | Admit: 2022-09-09 | Discharge: 2022-09-09 | Disposition: A | Discharge status: Home / Self Care | Payer: BC Managed Care – PPO | Source: Ambulatory Visit | Attending: Radiation Oncology | Admitting: Radiation Oncology

## 2022-09-09 ENCOUNTER — Encounter: Payer: Self-pay | Admitting: Radiation Oncology

## 2022-09-09 VITALS — BP 95/60 | HR 69 | Temp 97.6°F | Resp 18 | Ht 66.0 in | Wt 150.8 lb

## 2022-09-09 DIAGNOSIS — Z170 Estrogen receptor positive status [ER+]: Secondary | ICD-10-CM | POA: Insufficient documentation

## 2022-09-09 DIAGNOSIS — Z510 Encounter for antineoplastic radiation therapy: Secondary | ICD-10-CM | POA: Diagnosis present

## 2022-09-09 DIAGNOSIS — D0511 Intraductal carcinoma in situ of right breast: Secondary | ICD-10-CM | POA: Diagnosis present

## 2022-09-14 DIAGNOSIS — D0511 Intraductal carcinoma in situ of right breast: Secondary | ICD-10-CM | POA: Insufficient documentation

## 2022-09-14 DIAGNOSIS — Z510 Encounter for antineoplastic radiation therapy: Secondary | ICD-10-CM | POA: Insufficient documentation

## 2022-09-16 ENCOUNTER — Encounter: Payer: Self-pay | Admitting: *Deleted

## 2022-09-17 ENCOUNTER — Ambulatory Visit
Admission: RE | Admit: 2022-09-17 | Discharge: 2022-09-17 | Disposition: A | Discharge status: Home / Self Care | Payer: BC Managed Care – PPO | Source: Ambulatory Visit | Attending: Radiation Oncology | Admitting: Radiation Oncology

## 2022-09-17 ENCOUNTER — Other Ambulatory Visit: Payer: Self-pay

## 2022-09-17 DIAGNOSIS — D0511 Intraductal carcinoma in situ of right breast: Secondary | ICD-10-CM | POA: Diagnosis not present

## 2022-09-17 LAB — RAD ONC ARIA SESSION SUMMARY
Course Elapsed Days: 0
Plan Fractions Treated to Date: 1
Plan Prescribed Dose Per Fraction: 1.8 Gy
Plan Total Fractions Prescribed: 28
Plan Total Prescribed Dose: 50.4 Gy
Reference Point Dosage Given to Date: 1.8 Gy
Reference Point Session Dosage Given: 1.8 Gy
Session Number: 1

## 2022-09-18 ENCOUNTER — Ambulatory Visit
Admission: RE | Admit: 2022-09-18 | Discharge: 2022-09-18 | Disposition: A | Discharge status: Home / Self Care | Payer: BC Managed Care – PPO | Source: Ambulatory Visit | Attending: Radiation Oncology | Admitting: Radiation Oncology

## 2022-09-18 ENCOUNTER — Ambulatory Visit: Payer: BC Managed Care – PPO

## 2022-09-18 ENCOUNTER — Other Ambulatory Visit: Payer: Self-pay

## 2022-09-18 DIAGNOSIS — D0511 Intraductal carcinoma in situ of right breast: Secondary | ICD-10-CM | POA: Diagnosis not present

## 2022-09-18 LAB — RAD ONC ARIA SESSION SUMMARY
Course Elapsed Days: 1
Plan Fractions Treated to Date: 2
Plan Prescribed Dose Per Fraction: 1.8 Gy
Plan Total Fractions Prescribed: 28
Plan Total Prescribed Dose: 50.4 Gy
Reference Point Dosage Given to Date: 3.6 Gy
Reference Point Session Dosage Given: 1.8 Gy
Session Number: 2

## 2022-09-21 ENCOUNTER — Ambulatory Visit
Admission: RE | Admit: 2022-09-21 | Discharge: 2022-09-21 | Disposition: A | Discharge status: Home / Self Care | Payer: BC Managed Care – PPO | Source: Ambulatory Visit | Attending: Radiation Oncology | Admitting: Radiation Oncology

## 2022-09-21 ENCOUNTER — Other Ambulatory Visit: Payer: Self-pay

## 2022-09-21 ENCOUNTER — Ambulatory Visit: Payer: BC Managed Care – PPO

## 2022-09-21 DIAGNOSIS — D0511 Intraductal carcinoma in situ of right breast: Secondary | ICD-10-CM | POA: Diagnosis not present

## 2022-09-21 LAB — RAD ONC ARIA SESSION SUMMARY
Course Elapsed Days: 4
Plan Fractions Treated to Date: 3
Plan Prescribed Dose Per Fraction: 1.8 Gy
Plan Total Fractions Prescribed: 28
Plan Total Prescribed Dose: 50.4 Gy
Reference Point Dosage Given to Date: 5.4 Gy
Reference Point Session Dosage Given: 1.8 Gy
Session Number: 3

## 2022-09-22 ENCOUNTER — Ambulatory Visit
Admission: RE | Admit: 2022-09-22 | Discharge: 2022-09-22 | Disposition: A | Discharge status: Home / Self Care | Payer: BC Managed Care – PPO | Source: Ambulatory Visit | Attending: Radiation Oncology | Admitting: Radiation Oncology

## 2022-09-22 ENCOUNTER — Ambulatory Visit: Payer: BC Managed Care – PPO

## 2022-09-22 ENCOUNTER — Telehealth: Payer: Self-pay | Admitting: Hematology and Oncology

## 2022-09-22 ENCOUNTER — Other Ambulatory Visit: Payer: Self-pay

## 2022-09-22 DIAGNOSIS — D0511 Intraductal carcinoma in situ of right breast: Secondary | ICD-10-CM | POA: Diagnosis not present

## 2022-09-22 LAB — RAD ONC ARIA SESSION SUMMARY
Course Elapsed Days: 5
Plan Fractions Treated to Date: 4
Plan Prescribed Dose Per Fraction: 1.8 Gy
Plan Total Fractions Prescribed: 28
Plan Total Prescribed Dose: 50.4 Gy
Reference Point Dosage Given to Date: 7.2 Gy
Reference Point Session Dosage Given: 1.8 Gy
Session Number: 4

## 2022-09-22 MED ORDER — RADIAPLEXRX EX GEL: Freq: Once | CUTANEOUS | Status: AC

## 2022-09-22 MED ORDER — ALRA NON-METALLIC DEODORANT (RAD-ONC)
1.0000 | Freq: Once | TOPICAL | Status: AC
  Administered 2022-09-22: 1 via TOPICAL

## 2022-09-22 NOTE — Telephone Encounter (Signed)
Scheduled appointment per scheduling message. Patient is aware of the made appointment.

## 2022-09-23 ENCOUNTER — Other Ambulatory Visit: Payer: Self-pay

## 2022-09-23 ENCOUNTER — Ambulatory Visit: Payer: BC Managed Care – PPO

## 2022-09-23 ENCOUNTER — Ambulatory Visit
Admission: RE | Admit: 2022-09-23 | Discharge: 2022-09-23 | Disposition: A | Discharge status: Home / Self Care | Payer: BC Managed Care – PPO | Source: Ambulatory Visit | Attending: Radiation Oncology | Admitting: Radiation Oncology

## 2022-09-23 DIAGNOSIS — D0511 Intraductal carcinoma in situ of right breast: Secondary | ICD-10-CM | POA: Diagnosis not present

## 2022-09-23 LAB — RAD ONC ARIA SESSION SUMMARY
Course Elapsed Days: 6
Plan Fractions Treated to Date: 5
Plan Prescribed Dose Per Fraction: 1.8 Gy
Plan Total Fractions Prescribed: 28
Plan Total Prescribed Dose: 50.4 Gy
Reference Point Dosage Given to Date: 9 Gy
Reference Point Session Dosage Given: 1.8 Gy
Session Number: 5

## 2022-09-24 ENCOUNTER — Ambulatory Visit
Admission: RE | Admit: 2022-09-24 | Discharge: 2022-09-24 | Disposition: A | Discharge status: Home / Self Care | Payer: BC Managed Care – PPO | Source: Ambulatory Visit | Attending: Radiation Oncology | Admitting: Radiation Oncology

## 2022-09-24 ENCOUNTER — Other Ambulatory Visit: Payer: Self-pay

## 2022-09-24 ENCOUNTER — Ambulatory Visit: Payer: BC Managed Care – PPO

## 2022-09-24 DIAGNOSIS — D0511 Intraductal carcinoma in situ of right breast: Secondary | ICD-10-CM | POA: Diagnosis not present

## 2022-09-24 LAB — RAD ONC ARIA SESSION SUMMARY
Course Elapsed Days: 7
Plan Fractions Treated to Date: 6
Plan Prescribed Dose Per Fraction: 1.8 Gy
Plan Total Fractions Prescribed: 28
Plan Total Prescribed Dose: 50.4 Gy
Reference Point Dosage Given to Date: 10.8 Gy
Reference Point Session Dosage Given: 1.8 Gy
Session Number: 6

## 2022-09-25 ENCOUNTER — Ambulatory Visit: Payer: BC Managed Care – PPO

## 2022-09-25 ENCOUNTER — Other Ambulatory Visit: Payer: Self-pay

## 2022-09-25 ENCOUNTER — Ambulatory Visit
Admission: RE | Admit: 2022-09-25 | Discharge: 2022-09-25 | Disposition: A | Discharge status: Home / Self Care | Payer: BC Managed Care – PPO | Source: Ambulatory Visit | Attending: Radiation Oncology | Admitting: Radiation Oncology

## 2022-09-25 DIAGNOSIS — D0511 Intraductal carcinoma in situ of right breast: Secondary | ICD-10-CM | POA: Diagnosis not present

## 2022-09-25 LAB — RAD ONC ARIA SESSION SUMMARY
Course Elapsed Days: 8
Plan Fractions Treated to Date: 7
Plan Prescribed Dose Per Fraction: 1.8 Gy
Plan Total Fractions Prescribed: 28
Plan Total Prescribed Dose: 50.4 Gy
Reference Point Dosage Given to Date: 12.6 Gy
Reference Point Session Dosage Given: 1.8 Gy
Session Number: 7

## 2022-09-28 ENCOUNTER — Other Ambulatory Visit: Payer: Self-pay

## 2022-09-28 ENCOUNTER — Ambulatory Visit: Payer: BC Managed Care – PPO

## 2022-09-28 ENCOUNTER — Ambulatory Visit
Admission: RE | Admit: 2022-09-28 | Discharge: 2022-09-28 | Disposition: A | Discharge status: Home / Self Care | Payer: BC Managed Care – PPO | Source: Ambulatory Visit | Attending: Radiation Oncology | Admitting: Radiation Oncology

## 2022-09-28 DIAGNOSIS — D0511 Intraductal carcinoma in situ of right breast: Secondary | ICD-10-CM | POA: Diagnosis not present

## 2022-09-28 LAB — RAD ONC ARIA SESSION SUMMARY
Course Elapsed Days: 11
Plan Fractions Treated to Date: 8
Plan Prescribed Dose Per Fraction: 1.8 Gy
Plan Total Fractions Prescribed: 28
Plan Total Prescribed Dose: 50.4 Gy
Reference Point Dosage Given to Date: 14.4 Gy
Reference Point Session Dosage Given: 1.8 Gy
Session Number: 8

## 2022-09-29 ENCOUNTER — Other Ambulatory Visit: Payer: Self-pay

## 2022-09-29 ENCOUNTER — Ambulatory Visit
Admission: RE | Admit: 2022-09-29 | Discharge: 2022-09-29 | Disposition: A | Discharge status: Home / Self Care | Payer: BC Managed Care – PPO | Source: Ambulatory Visit | Attending: Radiation Oncology | Admitting: Radiation Oncology

## 2022-09-29 ENCOUNTER — Ambulatory Visit: Payer: BC Managed Care – PPO

## 2022-09-29 DIAGNOSIS — D0511 Intraductal carcinoma in situ of right breast: Secondary | ICD-10-CM | POA: Diagnosis not present

## 2022-09-29 LAB — RAD ONC ARIA SESSION SUMMARY
Course Elapsed Days: 12
Plan Fractions Treated to Date: 9
Plan Prescribed Dose Per Fraction: 1.8 Gy
Plan Total Fractions Prescribed: 28
Plan Total Prescribed Dose: 50.4 Gy
Reference Point Dosage Given to Date: 16.2 Gy
Reference Point Session Dosage Given: 1.8 Gy
Session Number: 9

## 2022-09-30 ENCOUNTER — Ambulatory Visit
Admission: RE | Admit: 2022-09-30 | Discharge: 2022-09-30 | Disposition: A | Discharge status: Home / Self Care | Payer: BC Managed Care – PPO | Source: Ambulatory Visit | Attending: Radiation Oncology | Admitting: Radiation Oncology

## 2022-09-30 ENCOUNTER — Other Ambulatory Visit: Payer: Self-pay

## 2022-09-30 ENCOUNTER — Ambulatory Visit: Payer: BC Managed Care – PPO

## 2022-09-30 DIAGNOSIS — D0511 Intraductal carcinoma in situ of right breast: Secondary | ICD-10-CM | POA: Diagnosis not present

## 2022-09-30 LAB — RAD ONC ARIA SESSION SUMMARY
Course Elapsed Days: 13
Plan Fractions Treated to Date: 10
Plan Prescribed Dose Per Fraction: 1.8 Gy
Plan Total Fractions Prescribed: 28
Plan Total Prescribed Dose: 50.4 Gy
Reference Point Dosage Given to Date: 18 Gy
Reference Point Session Dosage Given: 1.8 Gy
Session Number: 10

## 2022-10-01 ENCOUNTER — Ambulatory Visit
Admission: RE | Admit: 2022-10-01 | Discharge: 2022-10-01 | Disposition: A | Discharge status: Home / Self Care | Payer: BC Managed Care – PPO | Source: Ambulatory Visit | Attending: Radiation Oncology | Admitting: Radiation Oncology

## 2022-10-01 ENCOUNTER — Ambulatory Visit: Payer: BC Managed Care – PPO

## 2022-10-01 ENCOUNTER — Other Ambulatory Visit: Payer: Self-pay

## 2022-10-01 DIAGNOSIS — D0511 Intraductal carcinoma in situ of right breast: Secondary | ICD-10-CM | POA: Diagnosis not present

## 2022-10-01 LAB — RAD ONC ARIA SESSION SUMMARY
Course Elapsed Days: 14
Plan Fractions Treated to Date: 11
Plan Prescribed Dose Per Fraction: 1.8 Gy
Plan Total Fractions Prescribed: 28
Plan Total Prescribed Dose: 50.4 Gy
Reference Point Dosage Given to Date: 19.8 Gy
Reference Point Session Dosage Given: 1.8 Gy
Session Number: 11

## 2022-10-02 ENCOUNTER — Ambulatory Visit: Payer: BC Managed Care – PPO

## 2022-10-02 ENCOUNTER — Other Ambulatory Visit: Payer: Self-pay

## 2022-10-02 ENCOUNTER — Ambulatory Visit
Admission: RE | Admit: 2022-10-02 | Discharge: 2022-10-02 | Disposition: A | Discharge status: Home / Self Care | Payer: BC Managed Care – PPO | Source: Ambulatory Visit | Attending: Radiation Oncology | Admitting: Radiation Oncology

## 2022-10-02 DIAGNOSIS — D0511 Intraductal carcinoma in situ of right breast: Secondary | ICD-10-CM | POA: Diagnosis not present

## 2022-10-02 LAB — RAD ONC ARIA SESSION SUMMARY
Course Elapsed Days: 15
Plan Fractions Treated to Date: 12
Plan Prescribed Dose Per Fraction: 1.8 Gy
Plan Total Fractions Prescribed: 28
Plan Total Prescribed Dose: 50.4 Gy
Reference Point Dosage Given to Date: 21.6 Gy
Reference Point Session Dosage Given: 1.8 Gy
Session Number: 12

## 2022-10-05 ENCOUNTER — Other Ambulatory Visit: Payer: Self-pay

## 2022-10-05 ENCOUNTER — Ambulatory Visit: Payer: BC Managed Care – PPO

## 2022-10-05 ENCOUNTER — Ambulatory Visit
Admission: RE | Admit: 2022-10-05 | Discharge: 2022-10-05 | Disposition: A | Discharge status: Home / Self Care | Payer: BC Managed Care – PPO | Source: Ambulatory Visit | Attending: Radiation Oncology | Admitting: Radiation Oncology

## 2022-10-05 DIAGNOSIS — D0511 Intraductal carcinoma in situ of right breast: Secondary | ICD-10-CM | POA: Diagnosis not present

## 2022-10-05 LAB — RAD ONC ARIA SESSION SUMMARY
Course Elapsed Days: 18
Plan Fractions Treated to Date: 13
Plan Prescribed Dose Per Fraction: 1.8 Gy
Plan Total Fractions Prescribed: 28
Plan Total Prescribed Dose: 50.4 Gy
Reference Point Dosage Given to Date: 23.4 Gy
Reference Point Session Dosage Given: 1.8 Gy
Session Number: 13

## 2022-10-06 ENCOUNTER — Other Ambulatory Visit: Payer: Self-pay

## 2022-10-06 ENCOUNTER — Ambulatory Visit
Admission: RE | Admit: 2022-10-06 | Discharge: 2022-10-06 | Disposition: A | Discharge status: Home / Self Care | Payer: BC Managed Care – PPO | Source: Ambulatory Visit | Attending: Radiation Oncology | Admitting: Radiation Oncology

## 2022-10-06 ENCOUNTER — Ambulatory Visit: Payer: BC Managed Care – PPO

## 2022-10-06 DIAGNOSIS — D0511 Intraductal carcinoma in situ of right breast: Secondary | ICD-10-CM | POA: Diagnosis not present

## 2022-10-06 LAB — RAD ONC ARIA SESSION SUMMARY
Course Elapsed Days: 19
Plan Fractions Treated to Date: 14
Plan Prescribed Dose Per Fraction: 1.8 Gy
Plan Total Fractions Prescribed: 28
Plan Total Prescribed Dose: 50.4 Gy
Reference Point Dosage Given to Date: 25.2 Gy
Reference Point Session Dosage Given: 1.8 Gy
Session Number: 14

## 2022-10-07 ENCOUNTER — Ambulatory Visit
Admission: RE | Admit: 2022-10-07 | Discharge: 2022-10-07 | Disposition: A | Discharge status: Home / Self Care | Payer: BC Managed Care – PPO | Source: Ambulatory Visit | Attending: Radiation Oncology | Admitting: Radiation Oncology

## 2022-10-07 ENCOUNTER — Ambulatory Visit: Payer: BC Managed Care – PPO

## 2022-10-07 ENCOUNTER — Telehealth: Payer: Self-pay | Admitting: Radiation Oncology

## 2022-10-07 ENCOUNTER — Other Ambulatory Visit: Payer: Self-pay

## 2022-10-07 DIAGNOSIS — D0511 Intraductal carcinoma in situ of right breast: Secondary | ICD-10-CM | POA: Diagnosis not present

## 2022-10-07 LAB — RAD ONC ARIA SESSION SUMMARY
Course Elapsed Days: 20
Plan Fractions Treated to Date: 15
Plan Prescribed Dose Per Fraction: 1.8 Gy
Plan Total Fractions Prescribed: 28
Plan Total Prescribed Dose: 50.4 Gy
Reference Point Dosage Given to Date: 27 Gy
Reference Point Session Dosage Given: 1.8 Gy
Session Number: 15

## 2022-10-07 NOTE — Telephone Encounter (Signed)
3/27 @ 4:05 pm patient stop by at the window in office to request earlier time (7:30 am) for her treatment appt for 3/28, spoke to RT on (L4 machine) is aware.

## 2022-10-08 ENCOUNTER — Other Ambulatory Visit: Payer: Self-pay

## 2022-10-08 ENCOUNTER — Ambulatory Visit
Admission: RE | Admit: 2022-10-08 | Discharge: 2022-10-08 | Disposition: A | Discharge status: Home / Self Care | Payer: BC Managed Care – PPO | Source: Ambulatory Visit | Attending: Radiation Oncology | Admitting: Radiation Oncology

## 2022-10-08 ENCOUNTER — Ambulatory Visit: Payer: BC Managed Care – PPO

## 2022-10-08 DIAGNOSIS — D0511 Intraductal carcinoma in situ of right breast: Secondary | ICD-10-CM | POA: Diagnosis not present

## 2022-10-08 LAB — RAD ONC ARIA SESSION SUMMARY
Course Elapsed Days: 21
Plan Fractions Treated to Date: 16
Plan Prescribed Dose Per Fraction: 1.8 Gy
Plan Total Fractions Prescribed: 28
Plan Total Prescribed Dose: 50.4 Gy
Reference Point Dosage Given to Date: 28.8 Gy
Reference Point Session Dosage Given: 1.8 Gy
Session Number: 16

## 2022-10-09 ENCOUNTER — Other Ambulatory Visit: Payer: Self-pay

## 2022-10-09 ENCOUNTER — Ambulatory Visit: Payer: BC Managed Care – PPO

## 2022-10-09 ENCOUNTER — Ambulatory Visit
Admission: RE | Admit: 2022-10-09 | Discharge: 2022-10-09 | Disposition: A | Discharge status: Home / Self Care | Payer: BC Managed Care – PPO | Source: Ambulatory Visit | Attending: Radiation Oncology | Admitting: Radiation Oncology

## 2022-10-09 DIAGNOSIS — D0511 Intraductal carcinoma in situ of right breast: Secondary | ICD-10-CM | POA: Diagnosis not present

## 2022-10-09 LAB — RAD ONC ARIA SESSION SUMMARY
Course Elapsed Days: 22
Plan Fractions Treated to Date: 17
Plan Prescribed Dose Per Fraction: 1.8 Gy
Plan Total Fractions Prescribed: 28
Plan Total Prescribed Dose: 50.4 Gy
Reference Point Dosage Given to Date: 30.6 Gy
Reference Point Session Dosage Given: 1.8 Gy
Session Number: 17

## 2022-10-12 ENCOUNTER — Ambulatory Visit
Admission: RE | Admit: 2022-10-12 | Discharge: 2022-10-12 | Disposition: A | Discharge status: Home / Self Care | Payer: BC Managed Care – PPO | Source: Ambulatory Visit | Attending: Radiation Oncology | Admitting: Radiation Oncology

## 2022-10-12 ENCOUNTER — Ambulatory Visit: Payer: BC Managed Care – PPO

## 2022-10-12 ENCOUNTER — Other Ambulatory Visit: Payer: Self-pay

## 2022-10-12 DIAGNOSIS — D0511 Intraductal carcinoma in situ of right breast: Secondary | ICD-10-CM | POA: Insufficient documentation

## 2022-10-12 LAB — RAD ONC ARIA SESSION SUMMARY
Course Elapsed Days: 25
Plan Fractions Treated to Date: 18
Plan Prescribed Dose Per Fraction: 1.8 Gy
Plan Total Fractions Prescribed: 28
Plan Total Prescribed Dose: 50.4 Gy
Reference Point Dosage Given to Date: 32.4 Gy
Reference Point Session Dosage Given: 1.8 Gy
Session Number: 18

## 2022-10-13 ENCOUNTER — Other Ambulatory Visit: Payer: Self-pay

## 2022-10-13 ENCOUNTER — Ambulatory Visit: Payer: BC Managed Care – PPO

## 2022-10-13 ENCOUNTER — Ambulatory Visit
Admission: RE | Admit: 2022-10-13 | Discharge: 2022-10-13 | Disposition: A | Discharge status: Home / Self Care | Payer: BC Managed Care – PPO | Source: Ambulatory Visit | Attending: Radiation Oncology | Admitting: Radiation Oncology

## 2022-10-13 DIAGNOSIS — D0511 Intraductal carcinoma in situ of right breast: Secondary | ICD-10-CM | POA: Diagnosis not present

## 2022-10-13 LAB — RAD ONC ARIA SESSION SUMMARY
Course Elapsed Days: 26
Plan Fractions Treated to Date: 19
Plan Prescribed Dose Per Fraction: 1.8 Gy
Plan Total Fractions Prescribed: 28
Plan Total Prescribed Dose: 50.4 Gy
Reference Point Dosage Given to Date: 34.2 Gy
Reference Point Session Dosage Given: 1.8 Gy
Session Number: 19

## 2022-10-14 ENCOUNTER — Ambulatory Visit: Payer: BC Managed Care – PPO

## 2022-10-14 ENCOUNTER — Other Ambulatory Visit: Payer: Self-pay

## 2022-10-14 ENCOUNTER — Ambulatory Visit
Admission: RE | Admit: 2022-10-14 | Discharge: 2022-10-14 | Disposition: A | Discharge status: Home / Self Care | Payer: BC Managed Care – PPO | Source: Ambulatory Visit | Attending: Radiation Oncology | Admitting: Radiation Oncology

## 2022-10-14 DIAGNOSIS — D0511 Intraductal carcinoma in situ of right breast: Secondary | ICD-10-CM | POA: Diagnosis not present

## 2022-10-14 LAB — RAD ONC ARIA SESSION SUMMARY
Course Elapsed Days: 27
Plan Fractions Treated to Date: 20
Plan Prescribed Dose Per Fraction: 1.8 Gy
Plan Total Fractions Prescribed: 28
Plan Total Prescribed Dose: 50.4 Gy
Reference Point Dosage Given to Date: 36 Gy
Reference Point Session Dosage Given: 1.8 Gy
Session Number: 20

## 2022-10-15 ENCOUNTER — Ambulatory Visit
Admission: RE | Admit: 2022-10-15 | Discharge: 2022-10-15 | Disposition: A | Discharge status: Home / Self Care | Payer: BC Managed Care – PPO | Source: Ambulatory Visit | Attending: Radiation Oncology | Admitting: Radiation Oncology

## 2022-10-15 ENCOUNTER — Ambulatory Visit: Payer: BC Managed Care – PPO

## 2022-10-15 ENCOUNTER — Other Ambulatory Visit: Payer: Self-pay

## 2022-10-15 DIAGNOSIS — D0511 Intraductal carcinoma in situ of right breast: Secondary | ICD-10-CM | POA: Diagnosis not present

## 2022-10-15 LAB — RAD ONC ARIA SESSION SUMMARY
Course Elapsed Days: 28
Plan Fractions Treated to Date: 21
Plan Prescribed Dose Per Fraction: 1.8 Gy
Plan Total Fractions Prescribed: 28
Plan Total Prescribed Dose: 50.4 Gy
Reference Point Dosage Given to Date: 37.8 Gy
Reference Point Session Dosage Given: 1.8 Gy
Session Number: 21

## 2022-10-16 ENCOUNTER — Other Ambulatory Visit: Payer: Self-pay

## 2022-10-16 ENCOUNTER — Ambulatory Visit
Admission: RE | Admit: 2022-10-16 | Discharge: 2022-10-16 | Disposition: A | Discharge status: Home / Self Care | Payer: BC Managed Care – PPO | Source: Ambulatory Visit | Attending: Radiation Oncology | Admitting: Radiation Oncology

## 2022-10-16 ENCOUNTER — Ambulatory Visit: Payer: BC Managed Care – PPO

## 2022-10-16 DIAGNOSIS — D0511 Intraductal carcinoma in situ of right breast: Secondary | ICD-10-CM | POA: Diagnosis not present

## 2022-10-16 LAB — RAD ONC ARIA SESSION SUMMARY
Course Elapsed Days: 29
Plan Fractions Treated to Date: 22
Plan Prescribed Dose Per Fraction: 1.8 Gy
Plan Total Fractions Prescribed: 28
Plan Total Prescribed Dose: 50.4 Gy
Reference Point Dosage Given to Date: 39.6 Gy
Reference Point Session Dosage Given: 1.8 Gy
Session Number: 22

## 2022-10-19 ENCOUNTER — Other Ambulatory Visit: Payer: Self-pay

## 2022-10-19 ENCOUNTER — Ambulatory Visit
Admission: RE | Admit: 2022-10-19 | Discharge: 2022-10-19 | Disposition: A | Discharge status: Home / Self Care | Payer: BC Managed Care – PPO | Source: Ambulatory Visit | Attending: Radiation Oncology | Admitting: Radiation Oncology

## 2022-10-19 ENCOUNTER — Ambulatory Visit: Payer: BC Managed Care – PPO

## 2022-10-19 DIAGNOSIS — D0511 Intraductal carcinoma in situ of right breast: Secondary | ICD-10-CM | POA: Diagnosis not present

## 2022-10-19 LAB — RAD ONC ARIA SESSION SUMMARY
Course Elapsed Days: 32
Plan Fractions Treated to Date: 23
Plan Prescribed Dose Per Fraction: 1.8 Gy
Plan Total Fractions Prescribed: 28
Plan Total Prescribed Dose: 50.4 Gy
Reference Point Dosage Given to Date: 41.4 Gy
Reference Point Session Dosage Given: 1.8 Gy
Session Number: 23

## 2022-10-20 ENCOUNTER — Ambulatory Visit
Admission: RE | Admit: 2022-10-20 | Discharge: 2022-10-20 | Disposition: A | Discharge status: Home / Self Care | Payer: BC Managed Care – PPO | Source: Ambulatory Visit | Attending: Radiation Oncology | Admitting: Radiation Oncology

## 2022-10-20 ENCOUNTER — Ambulatory Visit: Payer: BC Managed Care – PPO | Admitting: Radiation Oncology

## 2022-10-20 ENCOUNTER — Ambulatory Visit: Payer: BC Managed Care – PPO

## 2022-10-20 ENCOUNTER — Other Ambulatory Visit: Payer: Self-pay

## 2022-10-20 DIAGNOSIS — D0511 Intraductal carcinoma in situ of right breast: Secondary | ICD-10-CM | POA: Diagnosis not present

## 2022-10-20 LAB — RAD ONC ARIA SESSION SUMMARY
Course Elapsed Days: 33
Plan Fractions Treated to Date: 24
Plan Prescribed Dose Per Fraction: 1.8 Gy
Plan Total Fractions Prescribed: 28
Plan Total Prescribed Dose: 50.4 Gy
Reference Point Dosage Given to Date: 43.2 Gy
Reference Point Session Dosage Given: 1.8 Gy
Session Number: 24

## 2022-10-21 ENCOUNTER — Inpatient Hospital Stay (HOSPITAL_BASED_OUTPATIENT_CLINIC_OR_DEPARTMENT_OTHER): Payer: BC Managed Care – PPO | Admitting: Hematology and Oncology

## 2022-10-21 ENCOUNTER — Ambulatory Visit: Payer: BC Managed Care – PPO

## 2022-10-21 ENCOUNTER — Other Ambulatory Visit: Payer: Self-pay

## 2022-10-21 ENCOUNTER — Ambulatory Visit
Admission: RE | Admit: 2022-10-21 | Discharge: 2022-10-21 | Disposition: A | Discharge status: Home / Self Care | Payer: BC Managed Care – PPO | Source: Ambulatory Visit | Attending: Radiation Oncology | Admitting: Radiation Oncology

## 2022-10-21 VITALS — BP 110/67 | HR 75 | Temp 97.9°F | Resp 18 | Ht 66.0 in | Wt 155.4 lb

## 2022-10-21 DIAGNOSIS — D0511 Intraductal carcinoma in situ of right breast: Secondary | ICD-10-CM | POA: Insufficient documentation

## 2022-10-21 LAB — RAD ONC ARIA SESSION SUMMARY
Course Elapsed Days: 34
Plan Fractions Treated to Date: 25
Plan Prescribed Dose Per Fraction: 1.8 Gy
Plan Total Fractions Prescribed: 28
Plan Total Prescribed Dose: 50.4 Gy
Reference Point Dosage Given to Date: 45 Gy
Reference Point Session Dosage Given: 1.8 Gy
Session Number: 25

## 2022-10-21 NOTE — Assessment & Plan Note (Addendum)
08/11/2022:Right lumpectomy: DCIS 1.2 cm, margins negative (excision of the medial and superior margins had more of DCIS but final margins are negative) incidental papilloma, ER 40% weak to moderate, PR 2% moderate to strong   Adjuvant radiation started 09/21/2022-11/03/2022  Treatment plan:  Patient had blood work with her gynecologist to check for menopause.  We will need to review that before making a final decision regarding tamoxifen versus anastrozole therapy.   If she has to take tamoxifen then we will talk about slowly tapering off Wellbutrin and switching her to Effexor.  She may also try to taper off sertraline.  Anastrozole counseling: We discussed the risks and benefits of anti-estrogen therapy with aromatase inhibitors. These include but not limited to insomnia, hot flashes, mood changes, vaginal dryness, bone density loss, and weight gain. We strongly believe that the benefits far outweigh the risks. Patient understands these risks and consented to starting treatment. Planned treatment duration is 5 years.  Return to clinic in 3 months for survivorship care plan visit

## 2022-10-21 NOTE — Progress Notes (Signed)
Patient Care Team: Maxie Better, MD as PCP - General (Obstetrics and Gynecology) Pershing Proud, RN as Oncology Nurse Navigator Rogelia Boga, Eileen Stanford, RN as Oncology Nurse Navigator Serena Croissant, MD as Consulting Physician (Hematology and Oncology) Antony Blackbird, MD as Consulting Physician (Radiation Oncology) Harriette Bouillon, MD as Consulting Physician (General Surgery)  DIAGNOSIS:  Encounter Diagnosis  Name Primary?   Ductal carcinoma in situ (DCIS) of right breast Yes    SUMMARY OF ONCOLOGIC HISTORY: Oncology History  Ductal carcinoma in situ (DCIS) of right breast  07/15/2022 Initial Diagnosis   Focal pain in bilateral breasts. mammogram detected Occasions and asymmetry in the right breast upper outer quadrant (implants).  No ultrasound correlate.  Biopsy revealed high-grade DCIS with necrosis ER 40% PR 2%   07/22/2022 Cancer Staging   Staging form: Breast, AJCC 8th Edition - Clinical: Stage 0 (cTis (DCIS), cN0, cM0, ER+, PR+, HER2: Not Assessed) - Signed by Serena Croissant, MD on 07/22/2022 Stage prefix: Initial diagnosis Nuclear grade: G3    Genetic Testing   [facility] CancerNext-Expanded Panel+RNA was Negative. 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 Report date is 07/29/2022.  The CancerNext-Expanded gene panel offered by [facility] and includes sequencing, rearrangement, and RNA analysis for the following 77 genes: AIP, ALK, APC, ATM, AXIN2, BAP1, BARD1, BLM, BMPR1A, BRCA1, BRCA2, BRIP1, CDC73, CDH1, CDK4, CDKN1B, CDKN2A, CHEK2, CTNNA1, DICER1, FANCC, FH, FLCN, GALNT12, KIF1B, LZTR1, MAX, MEN1, MET, MLH1, MSH2, MSH3, MSH6, MUTYH, NBN, NF1, NF2, NTHL1, PALB2, PHOX2B, PMS2, POT1, PRKAR1A, PTCH1, PTEN, RAD51C, RAD51D, RB1, RECQL, RET, SDHA, SDHAF2, SDHB, SDHC, SDHD, SMAD4, SMARCA4, SMARCB1, SMARCE1, STK11, SUFU, TMEM127, TP53, TSC1, TSC2, VHL and XRCC2 (sequencing and deletion/duplication); EGFR, EGLN1, HOXB13, KIT, MITF, PDGFRA, POLD1,  and POLE (sequencing only); EPCAM and GREM1 (deletion/duplication only).    08/11/2022 Surgery   Right lumpectomy: DCIS 1.2 cm, margins negative (excision of the medial and superior margins had more of DCIS but final margins are negative) incidental papilloma, ER 40% weak to moderate, PR 2% moderate to strong     CHIEF COMPLIANT: Follow-up after radiation  INTERVAL HISTORY: Samantha Mcdowell is a 52 y.o. female is here because of recent diagnosis of right breast DCIS. She presents to the clinic for a follow-up. She reports radiation is going well.  She is starting to get radiation dermatitis and is applying topical cortisone and radio Plex gel.  She is here today to discuss the role of antiestrogen therapy.  Is difficult to ascertain whether she is in menopause because she had ablation.  She had labs drawn apparently with her gynecology's telephone 11 patient was actually sober and smooth.  She is likely assisting every 4 the treatment.  Okay office and we do not have a copy of those labs.   ALLERGIES:  is allergic to erythromycin.  MEDICATIONS:  Current Outpatient Medications  Medication Sig Dispense Refill   BuPROPion HCl (WELLBUTRIN XL PO) Take by mouth.     Sertraline HCl (ZOLOFT PO) Take by mouth.     tirzepatide (MOUNJARO) 2.5 MG/0.5ML Pen      No current facility-administered medications for this visit.    PHYSICAL EXAMINATION: ECOG PERFORMANCE STATUS: 1 - Symptomatic but completely ambulatory  Vitals:   10/21/22 1547  BP: 110/67  Pulse: 75  Resp: 18  Temp: 97.9 F (36.6 C)  SpO2: 99%   Filed Weights   10/21/22 1547  Weight: 155 lb 6.4 oz (70.5 kg)    LABORATORY DATA:  I have reviewed the data as listed    Latest Ref Rng & Units 07/22/2022   12:19 PM  CMP  Glucose 70 - 99 mg/dL 86   BUN 6 - 20 mg/dL 17   Creatinine 5.36 - 1.00 mg/dL 1.44   Sodium 315 - 400 mmol/L 140   Potassium 3.5 - 5.1 mmol/L 3.7   Chloride 98 - 111 mmol/L 105   CO2 22 - 32 mmol/L 32    Calcium 8.9 - 10.3 mg/dL 9.3   Total Protein 6.5 - 8.1 g/dL 7.1   Total Bilirubin 0.3 - 1.2 mg/dL 0.3   Alkaline Phos 38 - 126 U/L 126   AST 15 - 41 U/L 26   ALT 0 - 44 U/L 35     Lab Results  Component Value Date   WBC 5.5 07/22/2022   HGB 13.8 07/22/2022   HCT 40.7 07/22/2022   MCV 88.7 07/22/2022   PLT 301 07/22/2022   NEUTROABS 2.6 07/22/2022    ASSESSMENT & PLAN:  Ductal carcinoma in situ (DCIS) of right breast 08/11/2022:Right lumpectomy: DCIS 1.2 cm, margins negative (excision of the medial and superior margins had more of DCIS but final margins are negative) incidental papilloma, ER 40% weak to moderate, PR 2% moderate to strong   Adjuvant radiation started 09/21/2022-11/03/2022  Treatment plan:  Patient had blood work with her gynecologist to check for menopause.  We will need to review that before making a final decision regarding tamoxifen versus anastrozole therapy.   If she has to take tamoxifen then we will talk about slowly tapering off Wellbutrin and switching her to Effexor.  She may also try to taper off sertraline.  Anastrozole counseling: We discussed the risks and benefits of anti-estrogen therapy with aromatase inhibitors. These include but not limited to insomnia, hot flashes, mood changes, vaginal dryness, bone density loss, and weight gain. We strongly believe that the benefits far outweigh the risks. Patient understands these risks and consented to starting treatment. Planned treatment duration is 5 years.  Return to clinic in 3 months for survivorship care plan visit    No orders of the defined types were placed in this encounter.  The patient has a good understanding of the overall plan. she agrees with it. she will call with any problems that may develop before the next visit here. Total time spent: 30 mins including face to face time and time spent for planning, charting and co-ordination of care   Tamsen Meek, MD 10/21/22    I Janan Ridge am acting as a Neurosurgeon for [employer]  I have reviewed the above documentation for accuracy and completeness, and I agree with the above.

## 2022-10-22 ENCOUNTER — Telehealth: Payer: Self-pay | Admitting: Hematology and Oncology

## 2022-10-22 ENCOUNTER — Ambulatory Visit
Admission: RE | Admit: 2022-10-22 | Discharge: 2022-10-22 | Disposition: A | Discharge status: Home / Self Care | Payer: BC Managed Care – PPO | Source: Ambulatory Visit | Attending: Radiation Oncology | Admitting: Radiation Oncology

## 2022-10-22 ENCOUNTER — Other Ambulatory Visit: Payer: Self-pay

## 2022-10-22 ENCOUNTER — Ambulatory Visit: Payer: BC Managed Care – PPO

## 2022-10-22 DIAGNOSIS — D0511 Intraductal carcinoma in situ of right breast: Secondary | ICD-10-CM | POA: Diagnosis not present

## 2022-10-22 LAB — RAD ONC ARIA SESSION SUMMARY
Course Elapsed Days: 35
Plan Fractions Treated to Date: 26
Plan Prescribed Dose Per Fraction: 1.8 Gy
Plan Total Fractions Prescribed: 28
Plan Total Prescribed Dose: 50.4 Gy
Reference Point Dosage Given to Date: 46.8 Gy
Reference Point Session Dosage Given: 1.8 Gy
Session Number: 26

## 2022-10-22 NOTE — Telephone Encounter (Signed)
Left patient a vm regarding upcoming appointments  

## 2022-10-23 ENCOUNTER — Other Ambulatory Visit: Payer: Self-pay

## 2022-10-23 ENCOUNTER — Ambulatory Visit
Admission: RE | Admit: 2022-10-23 | Discharge: 2022-10-23 | Disposition: A | Discharge status: Home / Self Care | Payer: BC Managed Care – PPO | Source: Ambulatory Visit | Attending: Radiation Oncology | Admitting: Radiation Oncology

## 2022-10-23 ENCOUNTER — Ambulatory Visit: Payer: BC Managed Care – PPO

## 2022-10-23 DIAGNOSIS — D0511 Intraductal carcinoma in situ of right breast: Secondary | ICD-10-CM | POA: Diagnosis not present

## 2022-10-23 LAB — RAD ONC ARIA SESSION SUMMARY
Course Elapsed Days: 36
Plan Fractions Treated to Date: 27
Plan Prescribed Dose Per Fraction: 1.8 Gy
Plan Total Fractions Prescribed: 28
Plan Total Prescribed Dose: 50.4 Gy
Reference Point Dosage Given to Date: 48.6 Gy
Reference Point Session Dosage Given: 1.8 Gy
Session Number: 27

## 2022-10-26 ENCOUNTER — Other Ambulatory Visit: Payer: Self-pay

## 2022-10-26 ENCOUNTER — Ambulatory Visit: Payer: BC Managed Care – PPO

## 2022-10-26 ENCOUNTER — Ambulatory Visit
Admission: RE | Admit: 2022-10-26 | Discharge: 2022-10-26 | Disposition: A | Discharge status: Home / Self Care | Payer: BC Managed Care – PPO | Source: Ambulatory Visit | Attending: Radiation Oncology | Admitting: Radiation Oncology

## 2022-10-26 DIAGNOSIS — D0511 Intraductal carcinoma in situ of right breast: Secondary | ICD-10-CM | POA: Diagnosis not present

## 2022-10-26 LAB — RAD ONC ARIA SESSION SUMMARY
Course Elapsed Days: 39
Plan Fractions Treated to Date: 28
Plan Prescribed Dose Per Fraction: 1.8 Gy
Plan Total Fractions Prescribed: 28
Plan Total Prescribed Dose: 50.4 Gy
Reference Point Dosage Given to Date: 50.4 Gy
Reference Point Session Dosage Given: 1.8 Gy
Session Number: 28

## 2022-10-27 ENCOUNTER — Ambulatory Visit
Admission: RE | Admit: 2022-10-27 | Discharge: 2022-10-27 | Disposition: A | Discharge status: Home / Self Care | Payer: BC Managed Care – PPO | Source: Ambulatory Visit | Attending: Radiation Oncology | Admitting: Radiation Oncology

## 2022-10-27 ENCOUNTER — Other Ambulatory Visit: Payer: Self-pay

## 2022-10-27 ENCOUNTER — Ambulatory Visit: Payer: BC Managed Care – PPO

## 2022-10-27 DIAGNOSIS — D0511 Intraductal carcinoma in situ of right breast: Secondary | ICD-10-CM

## 2022-10-27 LAB — RAD ONC ARIA SESSION SUMMARY
Course Elapsed Days: 40
Plan Fractions Treated to Date: 1
Plan Prescribed Dose Per Fraction: 2 Gy
Plan Total Fractions Prescribed: 6
Plan Total Prescribed Dose: 12 Gy
Reference Point Dosage Given to Date: 2 Gy
Reference Point Session Dosage Given: 2 Gy
Session Number: 29

## 2022-10-27 MED ORDER — RADIAPLEXRX EX GEL: Freq: Once | CUTANEOUS | Status: AC

## 2022-10-28 ENCOUNTER — Ambulatory Visit: Payer: BC Managed Care – PPO

## 2022-10-28 ENCOUNTER — Other Ambulatory Visit: Payer: Self-pay

## 2022-10-28 ENCOUNTER — Ambulatory Visit
Admission: RE | Admit: 2022-10-28 | Discharge: 2022-10-28 | Disposition: A | Discharge status: Home / Self Care | Payer: BC Managed Care – PPO | Source: Ambulatory Visit | Attending: Radiation Oncology | Admitting: Radiation Oncology

## 2022-10-28 DIAGNOSIS — D0511 Intraductal carcinoma in situ of right breast: Secondary | ICD-10-CM | POA: Diagnosis not present

## 2022-10-28 LAB — RAD ONC ARIA SESSION SUMMARY
Course Elapsed Days: 41
Plan Fractions Treated to Date: 2
Plan Prescribed Dose Per Fraction: 2 Gy
Plan Total Fractions Prescribed: 6
Plan Total Prescribed Dose: 12 Gy
Reference Point Dosage Given to Date: 4 Gy
Reference Point Session Dosage Given: 2 Gy
Session Number: 30

## 2022-10-29 ENCOUNTER — Ambulatory Visit
Admission: RE | Admit: 2022-10-29 | Discharge: 2022-10-29 | Disposition: A | Discharge status: Home / Self Care | Payer: BC Managed Care – PPO | Source: Ambulatory Visit | Attending: Radiation Oncology | Admitting: Radiation Oncology

## 2022-10-29 ENCOUNTER — Other Ambulatory Visit: Payer: Self-pay

## 2022-10-29 ENCOUNTER — Ambulatory Visit: Payer: BC Managed Care – PPO

## 2022-10-29 DIAGNOSIS — D0511 Intraductal carcinoma in situ of right breast: Secondary | ICD-10-CM | POA: Diagnosis not present

## 2022-10-29 LAB — RAD ONC ARIA SESSION SUMMARY
Course Elapsed Days: 42
Plan Fractions Treated to Date: 3
Plan Prescribed Dose Per Fraction: 2 Gy
Plan Total Fractions Prescribed: 6
Plan Total Prescribed Dose: 12 Gy
Reference Point Dosage Given to Date: 6 Gy
Reference Point Session Dosage Given: 2 Gy
Session Number: 31

## 2022-10-30 ENCOUNTER — Ambulatory Visit: Payer: BC Managed Care – PPO

## 2022-10-30 ENCOUNTER — Ambulatory Visit
Admission: RE | Admit: 2022-10-30 | Discharge: 2022-10-30 | Disposition: A | Discharge status: Home / Self Care | Payer: BC Managed Care – PPO | Source: Ambulatory Visit | Attending: Radiation Oncology | Admitting: Radiation Oncology

## 2022-10-30 ENCOUNTER — Other Ambulatory Visit: Payer: Self-pay

## 2022-10-30 DIAGNOSIS — D0511 Intraductal carcinoma in situ of right breast: Secondary | ICD-10-CM | POA: Diagnosis not present

## 2022-10-30 LAB — RAD ONC ARIA SESSION SUMMARY
Course Elapsed Days: 43
Plan Fractions Treated to Date: 4
Plan Prescribed Dose Per Fraction: 2 Gy
Plan Total Fractions Prescribed: 6
Plan Total Prescribed Dose: 12 Gy
Reference Point Dosage Given to Date: 8 Gy
Reference Point Session Dosage Given: 2 Gy
Session Number: 32

## 2022-11-02 ENCOUNTER — Other Ambulatory Visit: Payer: Self-pay

## 2022-11-02 ENCOUNTER — Ambulatory Visit: Payer: BC Managed Care – PPO

## 2022-11-02 ENCOUNTER — Ambulatory Visit
Admission: RE | Admit: 2022-11-02 | Discharge: 2022-11-02 | Disposition: A | Discharge status: Home / Self Care | Payer: BC Managed Care – PPO | Source: Ambulatory Visit | Attending: Radiation Oncology | Admitting: Radiation Oncology

## 2022-11-02 ENCOUNTER — Encounter: Payer: Self-pay | Admitting: *Deleted

## 2022-11-02 DIAGNOSIS — D0511 Intraductal carcinoma in situ of right breast: Secondary | ICD-10-CM | POA: Diagnosis not present

## 2022-11-02 LAB — RAD ONC ARIA SESSION SUMMARY
Course Elapsed Days: 46
Plan Fractions Treated to Date: 5
Plan Prescribed Dose Per Fraction: 2 Gy
Plan Total Fractions Prescribed: 6
Plan Total Prescribed Dose: 12 Gy
Reference Point Dosage Given to Date: 10 Gy
Reference Point Session Dosage Given: 2 Gy
Session Number: 33

## 2022-11-03 ENCOUNTER — Other Ambulatory Visit: Payer: Self-pay

## 2022-11-03 ENCOUNTER — Ambulatory Visit: Payer: BC Managed Care – PPO

## 2022-11-03 ENCOUNTER — Ambulatory Visit
Admission: RE | Admit: 2022-11-03 | Discharge: 2022-11-03 | Disposition: A | Discharge status: Home / Self Care | Payer: BC Managed Care – PPO | Source: Ambulatory Visit | Attending: Radiation Oncology | Admitting: Radiation Oncology

## 2022-11-03 DIAGNOSIS — D0511 Intraductal carcinoma in situ of right breast: Secondary | ICD-10-CM | POA: Diagnosis not present

## 2022-11-03 LAB — RAD ONC ARIA SESSION SUMMARY
Course Elapsed Days: 47
Plan Fractions Treated to Date: 6
Plan Prescribed Dose Per Fraction: 2 Gy
Plan Total Fractions Prescribed: 6
Plan Total Prescribed Dose: 12 Gy
Reference Point Dosage Given to Date: 12 Gy
Reference Point Session Dosage Given: 2 Gy
Session Number: 34

## 2022-11-05 NOTE — Radiation Completion Notes (Signed)
Patient Name: Samantha Mcdowell, Samantha Mcdowell MRN: [id number] Date of Birth: September 04, 1970 Referring Physician: Nena Jordan COUSINS, M.D. Date of Service: 2022-11-05 Radiation Oncologist: Arnette Schaumann, M.D. [facility] [place]                             RADIATION ONCOLOGY END OF TREATMENT NOTE     Diagnosis: D05.11 Intraductal carcinoma in situ of right breast Staging on 2022-07-22: Ductal carcinoma in situ (DCIS) of right breast T=cTis (DCIS), N=cN0, M=cM0 Intent: Curative     ==========DELIVERED PLANS==========  First Treatment Date: 2022-09-17 - Last Treatment Date: 2022-11-03   Plan Name: Breast_R Site: Breast, Right Technique: 3D Mode: Photon Dose Per Fraction: 1.8 Gy Prescribed Dose (Delivered / Prescribed): 50.4 Gy / 50.4 Gy Prescribed Fxs (Delivered / Prescribed): 28 / 28   Plan Name: Breast_R_Bst Site: Breast, Right Technique: Electron Mode: Electron Dose Per Fraction: 2 Gy Prescribed Dose (Delivered / Prescribed): 12 Gy / 12 Gy Prescribed Fxs (Delivered / Prescribed): 6 / 6     ==========ON TREATMENT VISIT DATES========== 2022-09-22, 2022-09-29, 2022-10-06, 2022-10-13, 2022-10-20, 2022-10-27, 2022-11-03     ==========UPCOMING VISITS==========       ==========APPENDIX - ON TREATMENT VISIT NOTES==========   See weekly On Treatment Notes is Epic for details.

## 2022-12-02 ENCOUNTER — Encounter: Payer: Self-pay | Admitting: Radiation Oncology

## 2022-12-02 NOTE — Progress Notes (Signed)
  Radiation Oncology         [phone] ________________________________  Name: Samantha Mcdowell MRN: [id number]  Date: 12/03/2022  DOB: 10/26/1970  End of Treatment Note  Diagnosis: Stage 0 (cTis (DCIS), cN0, cM0) Right Breast, Intermediate to high-grade ductal carcinoma in-situ, ER+ / PR+ / Her2 not assessed: s/p lumpectomy      Indication for treatment: Curative       Radiation treatment dates: 09/17/22 through 11/03/22  Site/dose:    1) Right breast - 50.4 Gy delivered in 28 Fx at 1.8 Gy/Fx 2) Right breast boost - 12 Gy delivered in 6 Fx at 2 Gy/Fx  Beams/energy: 6X  Technique: 3D, Electron (boost tx)  Narrative: The patient tolerated radiation treatment relatively well. On the date of her final treatment, the patient endorsed fatigue and skin irritation. She denied any other side effects or concerns with treatment. Physical exam performed on the date of her final treatment showed hyperpigmentation changes throughout the right breast area. No moist desquamation was appreciated. Brisk erythema was however noted in her most recent boost field.  Plan: The patient has completed radiation treatment. The patient will return to radiation oncology clinic for routine followup in one month. I advised them to call or return sooner if they have any questions or concerns related to their recovery or treatment.  -----------------------------------  Billie Lade, PhD, MD  This document serves as a record of services personally performed by Antony Blackbird, MD. It was created on his behalf by Neena Rhymes, a trained medical scribe. The creation of this record is based on the scribe's personal observations and the provider's statements to them. This document has been checked and approved by the attending provider.

## 2022-12-03 ENCOUNTER — Ambulatory Visit
Admission: RE | Admit: 2022-12-03 | Discharge: 2022-12-03 | Disposition: A | Discharge status: Home / Self Care | Payer: BC Managed Care – PPO | Source: Ambulatory Visit | Attending: Radiation Oncology | Admitting: Radiation Oncology

## 2022-12-03 ENCOUNTER — Encounter: Payer: Self-pay | Admitting: Radiation Oncology

## 2022-12-03 VITALS — BP 101/65 | HR 80 | Temp 98.4°F | Resp 18 | Wt 154.2 lb

## 2022-12-03 DIAGNOSIS — Z170 Estrogen receptor positive status [ER+]: Secondary | ICD-10-CM | POA: Diagnosis not present

## 2022-12-03 DIAGNOSIS — D0511 Intraductal carcinoma in situ of right breast: Secondary | ICD-10-CM | POA: Insufficient documentation

## 2022-12-03 DIAGNOSIS — Z923 Personal history of irradiation: Secondary | ICD-10-CM | POA: Insufficient documentation

## 2022-12-03 HISTORY — DX: Personal history of irradiation: Z92.3

## 2022-12-03 NOTE — Progress Notes (Signed)
Samantha Mcdowell is here today for follow up post radiation to the breast.   Breast Side:Right   They completed their radiation on: 11/03/22   Does the patient complain of any of the following: Post radiation skin issues:  No Breast Tenderness: Yes,  reports mild pain to breast.   Breast Swelling: Yes, mild.  Lymphadema: No  Range of Motion limitations: No Fatigue post radiation: Yes, energy level returning slowly.  Appetite good/fair/poor: good   Additional comments if applicable:   BP 101/65   Pulse 80   Temp 98.4 F (36.9 C)   Resp 18   Wt 154 lb 3.2 oz (69.9 kg)   LMP 09/08/2011   SpO2 99%   BMI 24.89 kg/m

## 2022-12-03 NOTE — Progress Notes (Signed)
Radiation Oncology         [phone]) [phone] ________________________________  Name: Samantha Mcdowell MRN: [id number]  Date: 12/03/2022  DOB: 1970/12/06  Follow-Up Visit Note  CC: Maxie Better, MD  Maxie Better, MD    ICD-10-CM   1. Ductal carcinoma in situ (DCIS) of right breast [D05.11]  D05.11       Diagnosis: Stage 0 (cTis (DCIS), cN0, cM0) Right Breast, Intermediate to high-grade ductal carcinoma in-situ, ER+ / PR+ / Her2 not assessed: s/p lumpectomy and XRT    Interval Since Last Radiation: 1 month  Indication for treatment: Curative      Radiation treatment dates: 09/17/22 through 11/03/22 Site/dose:    1) Right breast - 50.4 Gy delivered in 28 Fx at 1.8 Gy/Fx 2) Right breast boost - 12 Gy delivered in 6 Fx at 2 Gy/Fx Beams/energy: 6X Technique: 3D, Electron (boost tx)  Narrative:  The patient returns today for routine follow-up.  The patient tolerated radiation treatment relatively well. On the date of her final treatment, the patient endorsed fatigue and skin irritation. She denied any other side effects or concerns with treatment. Physical exam performed on the date of her final treatment showed hyperpigmentation changes throughout the right breast area. No moist desquamation was appreciated. Brisk erythema was however noted in her most recent boost field. She was given topical cortisone and Radiaplex to apply to these areas.    In terms of antiestrogen therapy, the patient's menopausal status is hard to determine due to a history of ablation. She did have blood work performed at her OB/GYN office which is pending. Antiestrogen treatment options recommended by Dr. Pamelia Hoit include tamoxifen or anastrozole. Dr. Pamelia Hoit will likely start her off on anastrozole, although her menopausal status will be a determining factor in her treatment choice. If she is found to need to take tamoxifen, then Dr. Pamelia Hoit may slowly tapering off Wellbutrin and switch her to Effexor. She may  also be tapered off sertraline.     Patient reports to be doing well overall today. She notes some phantom pains that were present prior to surgery, but have not been bothersome to her. She continues to gain energy since completing radiotherapy. She denies any breast swelling, range of motion limitations, or problems with lymphedema.                  Allergies:  is allergic to erythromycin.  Meds: Current Outpatient Medications  Medication Sig Dispense Refill   BuPROPion HCl (WELLBUTRIN XL PO) Take by mouth.     Sertraline HCl (ZOLOFT PO) Take by mouth.     tirzepatide (MOUNJARO) 2.5 MG/0.5ML Pen      No current facility-administered medications for this encounter.    Physical Findings: The patient is in no acute distress. Patient is alert and oriented.  weight is 154 lb 3.2 oz (69.9 kg). Her temperature is 98.4 F (36.9 C). Her blood pressure is 101/65 and her pulse is 80. Her respiration is 18 and oxygen saturation is 99%. .   Lungs are clear to auscultation bilaterally. Heart has regular rate and rhythm. No palpable cervical, supraclavicular, or axillary adenopathy. Abdomen soft, non-tender, normal bowel sounds.  Right Breast: Nontender to palpation. Hyperpigmentation to the treatment fields.   Lab Findings: Lab Results  Component Value Date   WBC 5.5 07/22/2022   HGB 13.8 07/22/2022   HCT 40.7 07/22/2022   MCV 88.7 07/22/2022   PLT 301 07/22/2022    Radiographic Findings: No results found.  Impression:  Stage 0 (cTis (DCIS), cN0, cM0) Right Breast, Intermediate to high-grade ductal carcinoma in-situ, ER+ / PR+ / Her2 not assessed: s/p lumpectomy and XRT     The patient is recovering from the effects of radiation.  Physical exam reveals well-healing skin in the treatment fields.   Plan:  Patient will begin anti-hormone therapy under the care of Dr. Pamelia Hoit. She will see him back in July. Radiation follow up PRN at this time. It was a pleasure taking part in this patient's  care. She knows to call back with any questions or concerns.     ____________________________________   Joyice Faster, PA-C   Billie Lade, PhD, MD  This document serves as a record of services personally performed by Antony Blackbird, MD. It was created on his behalf by Neena Rhymes, a trained medical scribe. The creation of this record is based on the scribe's personal observations and the provider's statements to them. This document has been checked and approved by the attending provider.

## 2022-12-20 ENCOUNTER — Encounter: Payer: Self-pay | Admitting: Hematology and Oncology

## 2022-12-20 ENCOUNTER — Encounter: Payer: Self-pay | Admitting: Adult Health

## 2022-12-24 ENCOUNTER — Telehealth: Payer: Self-pay | Admitting: Adult Health

## 2022-12-24 NOTE — Telephone Encounter (Signed)
Returning call per voicemail, patient need to reschedule appointment, called twice and left a message regarding rescheduled appointment date/times

## 2023-01-21 ENCOUNTER — Encounter: Payer: BC Managed Care – PPO | Admitting: Adult Health

## 2023-02-02 ENCOUNTER — Other Ambulatory Visit: Payer: Self-pay

## 2023-02-02 ENCOUNTER — Inpatient Hospital Stay: Payer: BC Managed Care – PPO | Attending: Radiation Oncology | Admitting: Adult Health

## 2023-02-02 ENCOUNTER — Encounter: Payer: Self-pay | Admitting: Adult Health

## 2023-02-02 VITALS — BP 110/74 | HR 77 | Temp 97.7°F | Resp 18 | Ht 66.0 in | Wt 162.2 lb

## 2023-02-02 DIAGNOSIS — D0511 Intraductal carcinoma in situ of right breast: Secondary | ICD-10-CM | POA: Diagnosis present

## 2023-02-02 DIAGNOSIS — Z923 Personal history of irradiation: Secondary | ICD-10-CM | POA: Insufficient documentation

## 2023-02-02 DIAGNOSIS — Z79811 Long term (current) use of aromatase inhibitors: Secondary | ICD-10-CM | POA: Diagnosis not present

## 2023-02-02 DIAGNOSIS — Z13820 Encounter for screening for osteoporosis: Secondary | ICD-10-CM

## 2023-02-02 MED ORDER — ANASTROZOLE 1 MG PO TABS
1.0000 mg | ORAL_TABLET | Freq: Every day | ORAL | 3 refills | Status: AC
Start: 2023-02-02 — End: ?

## 2023-02-02 NOTE — Progress Notes (Unsigned)
SURVIVORSHIP VISIT:  BRIEF ONCOLOGIC HISTORY:  Oncology History  Ductal carcinoma in situ (DCIS) of right breast  07/15/2022 Initial Diagnosis   Focal pain in bilateral breasts. mammogram detected Occasions and asymmetry in the right breast upper outer quadrant (implants).  No ultrasound correlate.  Biopsy revealed high-grade DCIS with necrosis ER 40% PR 2%   07/22/2022 Cancer Staging   Staging form: Breast, AJCC 8th Edition - Clinical: Stage 0 (cTis (DCIS), cN0, cM0, ER+, PR+, HER2: Not Assessed) - Signed by Serena Croissant, MD on 07/22/2022 Stage prefix: Initial diagnosis Nuclear grade: G3    Genetic Testing   [facility] CancerNext-Expanded Panel+RNA was Negative. 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 Report date is 07/29/2022.  The CancerNext-Expanded gene panel offered by [facility] and includes sequencing, rearrangement, and RNA analysis for the following 77 genes: AIP, ALK, APC, ATM, AXIN2, BAP1, BARD1, BLM, BMPR1A, BRCA1, BRCA2, BRIP1, CDC73, CDH1, CDK4, CDKN1B, CDKN2A, CHEK2, CTNNA1, DICER1, FANCC, FH, FLCN, GALNT12, KIF1B, LZTR1, MAX, MEN1, MET, MLH1, MSH2, MSH3, MSH6, MUTYH, NBN, NF1, NF2, NTHL1, PALB2, PHOX2B, PMS2, POT1, PRKAR1A, PTCH1, PTEN, RAD51C, RAD51D, RB1, RECQL, RET, SDHA, SDHAF2, SDHB, SDHC, SDHD, SMAD4, SMARCA4, SMARCB1, SMARCE1, STK11, SUFU, TMEM127, TP53, TSC1, TSC2, VHL and XRCC2 (sequencing and deletion/duplication); EGFR, EGLN1, HOXB13, KIT, MITF, PDGFRA, POLD1, and POLE (sequencing only); EPCAM and GREM1 (deletion/duplication only).    08/11/2022 Surgery   Right lumpectomy: DCIS 1.2 cm, margins negative (excision of the medial and superior margins had more of DCIS but final margins are negative) incidental papilloma, ER 40% weak to moderate, PR 2% moderate to strong   09/17/2022 - 11/03/2022 Radiation Therapy   Plan Name: Breast_R Site: Breast, Right Technique: 3D Mode: Photon Dose Per Fraction: 1.8 Gy Prescribed Dose  (Delivered / Prescribed): 50.4 Gy / 50.4 Gy Prescribed Fxs (Delivered / Prescribed): 28 / 28   Plan Name: Breast_R_Bst Site: Breast, Right Technique: Electron Mode: Electron Dose Per Fraction: 2 Gy Prescribed Dose (Delivered / Prescribed): 12 Gy / 12 Gy Prescribed Fxs (Delivered / Prescribed): 6 / 6       INTERVAL HISTORY:  Ms. Perrier to review her survivorship care plan detailing her treatment course for breast cancer, as well as monitoring long-term side effects of that treatment, education regarding health maintenance, screening, and overall wellness and health promotion.     Overall, Ms. Birt reports feeling quite well.  She has previously underwent FSH LH and estradiol testing with Dr. Cherly Hensen.  This was sent to [place] however it has not been scanned into our system.  She was able to pull this up on her phone and put it in through a MyChart message to indicate that she is postmenopausal.  She tells me that she is healing well since surgery and radiation.  She has no concerns today.   REVIEW OF SYSTEMS:  Review of Systems  Constitutional:  Negative for appetite change, chills, fatigue, fever and unexpected weight change.  HENT:   Negative for hearing loss, lump/mass and trouble swallowing.   Eyes:  Negative for eye problems and icterus.  Respiratory:  Negative for chest tightness, cough and shortness of breath.   Cardiovascular:  Negative for chest pain, leg swelling and palpitations.  Gastrointestinal:  Negative for abdominal distention, abdominal pain, constipation, diarrhea, nausea and vomiting.  Endocrine: Negative for hot flashes.  Genitourinary:  Negative for difficulty urinating.   Musculoskeletal:  Negative for arthralgias.  Skin:  Negative for itching and rash.  Neurological:  Negative for  dizziness, extremity weakness, headaches and numbness.  Hematological:  Negative for adenopathy. Does not bruise/bleed easily.  Psychiatric/Behavioral:  Negative for depression. The  patient is not nervous/anxious.    Breast: Denies any new nodularity, masses, tenderness, nipple changes, or nipple discharge.       PAST MEDICAL/SURGICAL HISTORY:  Past Medical History:  Diagnosis Date   DCIS (ductal carcinoma in situ) of breast    Depression    History of radiation therapy    Right breast- 09/17/22-11/03/22- Dr. Antony Blackbird   Past Surgical History:  Procedure Laterality Date   APPENDECTOMY     BREAST LUMPECTOMY WITH RADIOACTIVE SEED LOCALIZATION Right 08/11/2022   Procedure: RIGHT BREAST LUMPECTOMY WITH RADIOACTIVE SEED LOCALIZATION;  Surgeon: Harriette Bouillon, MD;  Location: [facility];  Service: General;  Laterality: Right;   BREAST SURGERY     DILATION AND CURETTAGE OF UTERUS       ALLERGIES:  Allergies  Allergen Reactions   Erythromycin Nausea Only     CURRENT MEDICATIONS:  Outpatient Encounter Medications as of 02/02/2023  Medication Sig   anastrozole (ARIMIDEX) 1 MG tablet Take 1 tablet (1 mg total) by mouth daily.   BuPROPion HCl (WELLBUTRIN XL PO) Take by mouth.   Sertraline HCl (ZOLOFT PO) Take by mouth.   tirzepatide (MOUNJARO) 2.5 MG/0.5ML Pen    No facility-administered encounter medications on file as of 02/02/2023.     ONCOLOGIC FAMILY HISTORY:  Family History  Problem Relation Age of Onset   Uterine cancer Mother 69   Melanoma Father 63       metastatic, agent orange   Lung cancer Paternal Grandfather        smoked     SOCIAL HISTORY:  Social History   Socioeconomic History   Marital status: Married    Spouse name: Not on file   Number of children: Not on file   Years of education: Not on file   Highest education level: Not on file  Occupational History   Not on file  Tobacco Use   Smoking status: Never   Smokeless tobacco: Never  Vaping Use   Vaping status: Never Used  Substance and Sexual Activity   Alcohol use: Yes    Comment: monthly   Drug use: No   Sexual activity: Not on file  Other Topics  Concern   Not on file  Social History Narrative   Not on file   Social Determinants of Health   Financial Resource Strain: Not on file  Food Insecurity: Not on file  Transportation Needs: Not on file  Physical Activity: Not on file  Stress: Not on file  Social Connections: Not on file  Intimate Partner Violence: Not on file     OBSERVATIONS/OBJECTIVE:  BP 110/74 (BP Location: Left Arm, Patient Position: Sitting)   Pulse 77   Temp 97.7 F (36.5 C) (Temporal)   Resp 18   Ht 5\' 6"  (1.676 m)   Wt 162 lb 3.2 oz (73.6 kg)   LMP 09/08/2011   SpO2 95%   BMI 26.18 kg/m  GENERAL: Patient is a well appearing female in no acute distress HEENT:  Sclerae anicteric.  Oropharynx clear and moist. No ulcerations or evidence of oropharyngeal candidiasis. Neck is supple.  NODES:  No cervical, supraclavicular, or axillary lymphadenopathy palpated.  BREAST EXAM: Right breast status postlumpectomy and radiation no sign of local recurrence left breast is benign. LUNGS:  Clear to auscultation bilaterally.  No wheezes or rhonchi. HEART:  Regular rate and  rhythm. No murmur appreciated. ABDOMEN:  Soft, nontender.  Positive, normoactive bowel sounds. No organomegaly palpated. MSK:  No focal spinal tenderness to palpation. Full range of motion bilaterally in the upper extremities. EXTREMITIES:  No peripheral edema.   SKIN:  Clear with no obvious rashes or skin changes. No nail dyscrasia. NEURO:  Nonfocal. Well oriented.  Appropriate affect.   LABORATORY DATA:  None for this visit.  DIAGNOSTIC IMAGING:  None for this visit.      ASSESSMENT AND PLAN:  Ms.. Toppins is a pleasant 52 y.o. female with Stage 0 right breast DCIS, ER+/PR+, diagnosed in 07/2022, treated with lumpectomy, adjuvant radiation therapy, and anti-estrogen therapy with anastrozole which can begin today. She presents to the Survivorship Clinic for our initial meeting and routine follow-up post-completion of treatment for breast  cancer.    1. Stage 0 right breast cancer:  Ms. Punches is continuing to recover from definitive treatment for breast cancer. She will follow-up with her medical oncologist, Dr.  Pamelia Hoit in 6 months with history and physical exam per surveillance protocol.  She will continue her anti-estrogen therapy with Anastrozole. Thus far, she is tolerating the Anastrozole well, with minimal side effects. Her mammogram is due 06/2023; orders placed today.   Today, a comprehensive survivorship care plan and treatment summary was reviewed with the patient today detailing her breast cancer diagnosis, treatment course, potential late/long-term effects of treatment, appropriate follow-up care with recommendations for the future, and patient education resources.  A copy of this summary, along with a letter will be sent to the patient's primary care provider via mail/fax/In Basket message after today's visit.    2. Bone health:  Given Ms. Adderley's age/history of breast cancer and her current treatment regimen including anti-estrogen therapy with Anastrozole, she is at risk for bone demineralization.  She has not undergone bone density testing and I placed orders for this to be completed.  She was given education on specific activities to promote bone health.  3. Cancer screening:  Due to Ms. Fontan's history and her age, she should receive screening for skin cancers, colon cancer, and gynecologic cancers.  The information and recommendations are listed on the patient's comprehensive care plan/treatment summary and were reviewed in detail with the patient.    4. Health maintenance and wellness promotion: Ms. Wisinski was encouraged to consume 5-7 servings of fruits and vegetables per day. We reviewed the "Nutrition Rainbow" handout.  She was also encouraged to engage in moderate to vigorous exercise for 30 minutes per day most days of the week.  She was instructed to limit her alcohol consumption and continue to abstain from  tobacco use.     5. Support services/counseling: It is not uncommon for this period of the patient's cancer care trajectory to be one of many emotions and stressors.   She was given information regarding our available services and encouraged to contact me with any questions or for help enrolling in any of our support group/programs.    Follow up instructions:    -Return to cancer center in 6 months for follow-up with Dr. Pamelia Hoit -Mammogram due in December 2024 Bone density testing ordered -She is welcome to return back to the Survivorship Clinic at any time; no additional follow-up needed at this time.  -Consider referral back to survivorship as a long-term survivor for continued surveillance  The patient was provided an opportunity to ask questions and all were answered. The patient agreed with the plan and demonstrated an understanding of the instructions.  Total encounter time:60 minutes*in face-to-face visit time, chart review, lab review, care coordination, order entry, and documentation of the encounter time.    Lillard Anes, NP 02/04/23 3:13 PM Medical Oncology and Hematology [facility] [address] [place], [place] [address] Tel. [phone]    Fax. [phone]  *Total Encounter Time as defined by the Centers for Medicare and Medicaid Services includes, in addition to the face-to-face time of a patient visit (documented in the note above) non-face-to-face time: obtaining and reviewing outside history, ordering and reviewing medications, tests or procedures, care coordination (communications with other health care professionals or caregivers) and documentation in the medical record.

## 2023-02-03 ENCOUNTER — Telehealth: Payer: Self-pay | Admitting: Adult Health

## 2023-02-03 NOTE — Telephone Encounter (Signed)
Scheduled appointment per 7/23 los. Patient is aware of the made appointment.

## 2023-02-15 ENCOUNTER — Ambulatory Visit (HOSPITAL_BASED_OUTPATIENT_CLINIC_OR_DEPARTMENT_OTHER)
Admission: RE | Admit: 2023-02-15 | Discharge: 2023-02-15 | Disposition: A | Discharge status: Home / Self Care | Payer: BC Managed Care – PPO | Source: Ambulatory Visit | Attending: Adult Health | Admitting: Adult Health

## 2023-02-15 DIAGNOSIS — Z13820 Encounter for screening for osteoporosis: Secondary | ICD-10-CM | POA: Insufficient documentation

## 2023-08-05 ENCOUNTER — Inpatient Hospital Stay: Payer: Self-pay | Admitting: Hematology and Oncology

## 2023-08-05 ENCOUNTER — Telehealth: Payer: Self-pay | Admitting: Hematology and Oncology

## 2023-08-05 NOTE — Telephone Encounter (Signed)
Patient called and stated need to reschedule do to work. Presecreted patient with Mardella Layman for 1/27 @1140 

## 2023-08-09 ENCOUNTER — Inpatient Hospital Stay: Payer: 59 | Attending: Adult Health | Admitting: Adult Health

## 2023-08-09 ENCOUNTER — Encounter: Payer: Self-pay | Admitting: Adult Health

## 2023-08-09 VITALS — BP 118/68 | HR 89 | Temp 97.2°F | Resp 16 | Wt 154.2 lb

## 2023-08-09 DIAGNOSIS — Z923 Personal history of irradiation: Secondary | ICD-10-CM | POA: Insufficient documentation

## 2023-08-09 DIAGNOSIS — D0511 Intraductal carcinoma in situ of right breast: Secondary | ICD-10-CM | POA: Insufficient documentation

## 2023-08-09 DIAGNOSIS — M8580 Other specified disorders of bone density and structure, unspecified site: Secondary | ICD-10-CM | POA: Diagnosis not present

## 2023-08-09 DIAGNOSIS — Z79811 Long term (current) use of aromatase inhibitors: Secondary | ICD-10-CM | POA: Insufficient documentation

## 2023-08-09 NOTE — Progress Notes (Signed)
[facility] Cancer Follow up:    Samantha Better, MD [address] [place] [address]   DIAGNOSIS:  Cancer Staging  Ductal carcinoma in situ (DCIS) of right breast Staging form: Breast, AJCC 8th Edition - Clinical: Stage 0 (cTis (DCIS), cN0, cM0, ER+, PR+, HER2: Not Assessed) - Signed by Serena Croissant, MD on 07/22/2022 Stage prefix: Initial diagnosis Nuclear grade: G3   SUMMARY OF ONCOLOGIC HISTORY: Oncology History  Ductal carcinoma in situ (DCIS) of right breast  07/15/2022 Initial Diagnosis   Focal pain in bilateral breasts. mammogram detected Occasions and asymmetry in the right breast upper outer quadrant (implants).  No ultrasound correlate.  Biopsy revealed high-grade DCIS with necrosis ER 40% PR 2%   07/22/2022 Cancer Staging   Staging form: Breast, AJCC 8th Edition - Clinical: Stage 0 (cTis (DCIS), cN0, cM0, ER+, PR+, HER2: Not Assessed) - Signed by Serena Croissant, MD on 07/22/2022 Stage prefix: Initial diagnosis Nuclear grade: G3    Genetic Testing   [facility] CancerNext-Expanded Panel+RNA was Negative. 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 Report date is 07/29/2022.  The CancerNext-Expanded gene panel offered by [facility] and includes sequencing, rearrangement, and RNA analysis for the following 77 genes: AIP, ALK, APC, ATM, AXIN2, BAP1, BARD1, BLM, BMPR1A, BRCA1, BRCA2, BRIP1, CDC73, CDH1, CDK4, CDKN1B, CDKN2A, CHEK2, CTNNA1, DICER1, FANCC, FH, FLCN, GALNT12, KIF1B, LZTR1, MAX, MEN1, MET, MLH1, MSH2, MSH3, MSH6, MUTYH, NBN, NF1, NF2, NTHL1, PALB2, PHOX2B, PMS2, POT1, PRKAR1A, PTCH1, PTEN, RAD51C, RAD51D, RB1, RECQL, RET, SDHA, SDHAF2, SDHB, SDHC, SDHD, SMAD4, SMARCA4, SMARCB1, SMARCE1, STK11, SUFU, TMEM127, TP53, TSC1, TSC2, VHL and XRCC2 (sequencing and deletion/duplication); EGFR, EGLN1, HOXB13, KIT, MITF, PDGFRA, POLD1, and POLE (sequencing only); EPCAM and GREM1 (deletion/duplication only).     08/11/2022 Surgery   Right lumpectomy: DCIS 1.2 cm, margins negative (excision of the medial and superior margins had more of DCIS but final margins are negative) incidental papilloma, ER 40% weak to moderate, PR 2% moderate to strong   09/17/2022 - 11/03/2022 Radiation Therapy   Plan Name: Breast_R Site: Breast, Right Technique: 3D Mode: Photon Dose Per Fraction: 1.8 Gy Prescribed Dose (Delivered / Prescribed): 50.4 Gy / 50.4 Gy Prescribed Fxs (Delivered / Prescribed): 28 / 28   Plan Name: Breast_R_Bst Site: Breast, Right Technique: Electron Mode: Electron Dose Per Fraction: 2 Gy Prescribed Dose (Delivered / Prescribed): 12 Gy / 12 Gy Prescribed Fxs (Delivered / Prescribed): 6 / 6       CURRENT THERAPY:Anastrozole  INTERVAL HISTORY:  Discussed the use of AI scribe software for clinical note transcription with the patient, who gave verbal consent to proceed.  Samantha Mcdowell 53 y.o. female with a history of noninvasive breast cancer, presents for a follow-up visit. She reports inconsistent adherence to anastrozole, a medication prescribed for breast cancer, but has been trying to acclimate to it over the past few weeks.  She had a mammogram in January at [place] [facility], which did not reveal any concerning findings.  The patient also has osteopenia, diagnosed in August, and has been taking calcium supplements. She is unsure about the best type of calcium to take and has been researching different options. She is physically active and wants to ensure she is not causing further harm to her bones.  The patient also reports experiencing hot flashes, which she attributes to the anastrozole. She is seeking advice on how to manage these symptoms. She has considered changing the time of day she  takes the medication and is open to trying other interventions, such as acupuncture.   Patient Active Problem List   Diagnosis Date Noted   Genetic testing 07/31/2022   Family history of melanoma  07/22/2022   Ductal carcinoma in situ (DCIS) of right breast 07/20/2022    is allergic to erythromycin.  MEDICAL HISTORY: Past Medical History:  Diagnosis Date   DCIS (ductal carcinoma in situ) of breast    Depression    History of radiation therapy    Right breast- 09/17/22-11/03/22- Dr. Antony Blackbird    SURGICAL HISTORY: Past Surgical History:  Procedure Laterality Date   APPENDECTOMY     BREAST LUMPECTOMY WITH RADIOACTIVE SEED LOCALIZATION Right 08/11/2022   Procedure: RIGHT BREAST LUMPECTOMY WITH RADIOACTIVE SEED LOCALIZATION;  Surgeon: Harriette Bouillon, MD;  Location: [facility];  Service: General;  Laterality: Right;   BREAST SURGERY     DILATION AND CURETTAGE OF UTERUS      SOCIAL HISTORY: Social History   Socioeconomic History   Marital status: Married    Spouse name: Not on file   Number of children: Not on file   Years of education: Not on file   Highest education level: Not on file  Occupational History   Not on file  Tobacco Use   Smoking status: Never   Smokeless tobacco: Never  Vaping Use   Vaping status: Never Used  Substance and Sexual Activity   Alcohol use: Yes    Comment: monthly   Drug use: No   Sexual activity: Not on file  Other Topics Concern   Not on file  Social History Narrative   Not on file   Social Drivers of Health   Financial Resource Strain: Not on file  Food Insecurity: Not on file  Transportation Needs: Not on file  Physical Activity: Not on file  Stress: Not on file  Social Connections: Not on file  Intimate Partner Violence: Not on file    FAMILY HISTORY: Family History  Problem Relation Age of Onset   Uterine cancer Mother 52   Melanoma Father 51       metastatic, agent orange   Lung cancer Paternal Grandfather        smoked    Review of Systems  Constitutional:  Negative for appetite change, chills, fatigue, fever and unexpected weight change.  HENT:   Negative for hearing loss, lump/mass and  trouble swallowing.   Eyes:  Negative for eye problems and icterus.  Respiratory:  Negative for chest tightness, cough and shortness of breath.   Cardiovascular:  Negative for chest pain, leg swelling and palpitations.  Gastrointestinal:  Negative for abdominal distention, abdominal pain, constipation, diarrhea, nausea and vomiting.  Endocrine: Positive for hot flashes.  Genitourinary:  Negative for difficulty urinating.   Musculoskeletal:  Negative for arthralgias.  Skin:  Negative for itching and rash.  Neurological:  Negative for dizziness, extremity weakness, headaches and numbness.  Hematological:  Negative for adenopathy. Does not bruise/bleed easily.  Psychiatric/Behavioral:  Negative for depression. The patient is not nervous/anxious.       PHYSICAL EXAMINATION    Vitals:   08/09/23 1123  BP: 118/68  Pulse: 89  Resp: 16  Temp: (!) 97.2 F (36.2 C)  SpO2: 98%    Physical Exam Constitutional:      General: She is not in acute distress.    Appearance: Normal appearance. She is not toxic-appearing.  HENT:     Head: Normocephalic and atraumatic.  Mouth/Throat:     Mouth: Mucous membranes are moist.     Pharynx: Oropharynx is clear. No oropharyngeal exudate or posterior oropharyngeal erythema.  Eyes:     General: No scleral icterus. Cardiovascular:     Rate and Rhythm: Normal rate and regular rhythm.     Pulses: Normal pulses.     Heart sounds: Normal heart sounds.  Pulmonary:     Effort: Pulmonary effort is normal.     Breath sounds: Normal breath sounds.  Chest:     Comments: Right breast status postlumpectomy and radiation no sign of local recurrence left breast is benign. Abdominal:     General: Abdomen is flat. Bowel sounds are normal. There is no distension.     Palpations: Abdomen is soft.     Tenderness: There is no abdominal tenderness.  Musculoskeletal:        General: No swelling.     Cervical back: Neck supple.  Lymphadenopathy:     Cervical:  No cervical adenopathy.     Upper Body:     Right upper body: No axillary adenopathy.     Left upper body: No axillary adenopathy.  Skin:    General: Skin is warm and dry.     Findings: No rash.  Neurological:     General: No focal deficit present.     Mental Status: She is alert.  Psychiatric:        Mood and Affect: Mood normal.        Behavior: Behavior normal.       ASSESSMENT and THERAPY PLAN:   Ductal carcinoma in situ (DCIS) of right breast 08/11/2022:Right lumpectomy: DCIS 1.2 cm, margins negative (excision of the medial and superior margins had more of DCIS but final margins are negative) incidental papilloma, ER 40% weak to moderate, PR 2% moderate to strong   Adjuvant radiation started 09/21/2022-11/03/2022 Anastrozole daily  ____________________________________________________________________________________________________________________________________________  Current Treatment: Anastrozole  Breast Cancer Patient is on anastrozole but has been inconsistent with taking it. No signs of recurrence on physical exam. Mammogram in January, 2025 was normal. -Continue anastrozole as prescribed. -Consider changing the time of day of administration to manage side effects. -Annual follow-up with Dr. Pamelia Hoit  Osteopenia Bone density test in August showed mild thinning of the bones. Patient is taking calcium supplements. -Continue calcium supplements, ensuring intake of 1200mg  daily. -Consider yoga for back pain management.  Hot Flashes Patient experiencing hot flashes, likely due to anastrozole. -Consider taking anastrozole at a different time of day. -Consider lifestyle modifications such as reducing caffeine intake. -Consider use of a neck fan during morning routine. -Consider acupuncture and/or Yoga for management of hot flashes.  RTC in 1 year for f/u, or sooner if needed.    All questions were answered. The patient knows to call the clinic with any problems,  questions or concerns. We can certainly see the patient much sooner if necessary.  Total encounter time: 30 minutes*in face-to-face visit time, chart review, lab review, care coordination, order entry, and documentation of the encounter time.    Lillard Anes, NP 08/09/23 12:40 PM Medical Oncology and Hematology [facility] [address] [place], [place] [address] Tel. [phone]    Fax. [phone]  *Total Encounter Time as defined by the Centers for Medicare and Medicaid Services includes, in addition to the face-to-face time of a patient visit (documented in the note above) non-face-to-face time: obtaining and reviewing outside history, ordering and reviewing medications, tests or procedures, care coordination (communications with other  health care professionals or caregivers) and documentation in the medical record.

## 2023-08-09 NOTE — Assessment & Plan Note (Signed)
08/11/2022:Right lumpectomy: DCIS 1.2 cm, margins negative (excision of the medial and superior margins had more of DCIS but final margins are negative) incidental papilloma, ER 40% weak to moderate, PR 2% moderate to strong   Adjuvant radiation started 09/21/2022-11/03/2022 Anastrozole daily  ____________________________________________________________________________________________________________________________________________  Current Treatment: Anastrozole  Breast Cancer Patient is on anastrozole but has been inconsistent with taking it. No signs of recurrence on physical exam. Mammogram in January, 2025 was normal. -Continue anastrozole as prescribed. -Consider changing the time of day of administration to manage side effects. -Annual follow-up with Dr. Pamelia Hoit  Osteopenia Bone density test in August showed mild thinning of the bones. Patient is taking calcium supplements. -Continue calcium supplements, ensuring intake of 1200mg  daily. -Consider yoga for back pain management.  Hot Flashes Patient experiencing hot flashes, likely due to anastrozole. -Consider taking anastrozole at a different time of day. -Consider lifestyle modifications such as reducing caffeine intake. -Consider use of a neck fan during morning routine. -Consider acupuncture and/or Yoga for management of hot flashes.  RTC in 1 year for f/u, or sooner if needed.

## 2023-08-12 ENCOUNTER — Encounter: Payer: Self-pay | Admitting: Adult Health

## 2023-11-24 ENCOUNTER — Telehealth: Payer: Self-pay

## 2023-11-24 NOTE — Telephone Encounter (Signed)
 S/w pt via phone this morning regarding MyChart message. She reports she had br augmentation in June 2018 in [place]. She currently denies redness, but does report swelling, warmth and discomfort. She is afebrile at the moment. She had a MM in January for a similar concern, but there was no br US  done. She is agreeable to a visit to further eval concern. Pt is agreeable to 140 Friday 5/16 with Alwin Baars, NP. Scheduled.

## 2023-11-26 ENCOUNTER — Encounter: Payer: Self-pay | Admitting: Adult Health

## 2023-11-26 ENCOUNTER — Inpatient Hospital Stay: Attending: Adult Health | Admitting: Adult Health

## 2023-11-26 VITALS — BP 117/78 | HR 89 | Temp 97.9°F | Resp 16 | Wt 158.7 lb

## 2023-11-26 DIAGNOSIS — D0511 Intraductal carcinoma in situ of right breast: Secondary | ICD-10-CM | POA: Insufficient documentation

## 2023-11-26 DIAGNOSIS — Z170 Estrogen receptor positive status [ER+]: Secondary | ICD-10-CM | POA: Diagnosis not present

## 2023-11-26 DIAGNOSIS — Z1721 Progesterone receptor positive status: Secondary | ICD-10-CM | POA: Diagnosis not present

## 2023-11-26 DIAGNOSIS — Z79811 Long term (current) use of aromatase inhibitors: Secondary | ICD-10-CM | POA: Diagnosis not present

## 2023-11-26 DIAGNOSIS — Z923 Personal history of irradiation: Secondary | ICD-10-CM | POA: Insufficient documentation

## 2023-11-26 DIAGNOSIS — N63 Unspecified lump in unspecified breast: Secondary | ICD-10-CM | POA: Diagnosis not present

## 2023-11-26 NOTE — Assessment & Plan Note (Signed)
 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  For breast discomfort will obtain mammogram and ultrasound of the right breast  from [facility].  Patient to f/u in 2-3 weeks to discuss next steps and whether MRI is needed.

## 2023-11-26 NOTE — Progress Notes (Signed)
 [facility] Cancer Follow up:    Samantha Marking, MD [address] [place] [address]   DIAGNOSIS:  Cancer Staging  Ductal carcinoma in situ (DCIS) of right breast Staging form: Breast, AJCC 8th Edition - Clinical: Stage 0 (cTis (DCIS), cN0, cM0, ER+, PR+, HER2: Not Assessed) - Signed by Cameron Cea, MD on 07/22/2022 Stage prefix: Initial diagnosis Nuclear grade: G3    SUMMARY OF ONCOLOGIC HISTORY: Oncology History  Ductal carcinoma in situ (DCIS) of right breast  07/15/2022 Initial Diagnosis   Focal pain in bilateral breasts. mammogram detected Occasions and asymmetry in the right breast upper outer quadrant (implants).  No ultrasound correlate.  Biopsy revealed high-grade DCIS with necrosis ER 40% PR 2%   07/22/2022 Cancer Staging   Staging form: Breast, AJCC 8th Edition - Clinical: Stage 0 (cTis (DCIS), cN0, cM0, ER+, PR+, HER2: Not Assessed) - Signed by Cameron Cea, MD on 07/22/2022 Stage prefix: Initial diagnosis Nuclear grade: G3    Genetic Testing   [facility] CancerNext-Expanded Panel+RNA was Negative. 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 Report date is 07/29/2022.  The CancerNext-Expanded gene panel offered by [facility] and includes sequencing, rearrangement, and RNA analysis for the following 77 genes: AIP, ALK, APC, ATM, AXIN2, BAP1, BARD1, BLM, BMPR1A, BRCA1, BRCA2, BRIP1, CDC73, CDH1, CDK4, CDKN1B, CDKN2A, CHEK2, CTNNA1, DICER1, FANCC, FH, FLCN, GALNT12, KIF1B, LZTR1, MAX, MEN1, MET, MLH1, MSH2, MSH3, MSH6, MUTYH, NBN, NF1, NF2, NTHL1, PALB2, PHOX2B, PMS2, POT1, PRKAR1A, PTCH1, PTEN, RAD51C, RAD51D, RB1, RECQL, RET, SDHA, SDHAF2, SDHB, SDHC, SDHD, SMAD4, SMARCA4, SMARCB1, SMARCE1, STK11, SUFU, TMEM127, TP53, TSC1, TSC2, VHL and XRCC2 (sequencing and deletion/duplication); EGFR, EGLN1, HOXB13, KIT, MITF, PDGFRA, POLD1, and POLE (sequencing only); EPCAM and GREM1 (deletion/duplication only).     08/11/2022 Surgery   Right lumpectomy: DCIS 1.2 cm, margins negative (excision of the medial and superior margins had more of DCIS but final margins are negative) incidental papilloma, ER 40% weak to moderate, PR 2% moderate to strong   09/17/2022 - 11/03/2022 Radiation Therapy   Plan Name: Breast_R Site: Breast, Right Technique: 3D Mode: Photon Dose Per Fraction: 1.8 Gy Prescribed Dose (Delivered / Prescribed): 50.4 Gy / 50.4 Gy Prescribed Fxs (Delivered / Prescribed): 28 / 28   Plan Name: Breast_R_Bst Site: Breast, Right Technique: Electron Mode: Electron Dose Per Fraction: 2 Gy Prescribed Dose (Delivered / Prescribed): 12 Gy / 12 Gy Prescribed Fxs (Delivered / Prescribed): 6 / 6       CURRENT THERAPY:  Anastrozole   INTERVAL HISTORY:  Samantha Mcdowell 53 y.o. female returns for a breast cancer arm.  Her most recent mammogram occurred on July 20, 2023 demonstrating no mammographic evidence of malignancy and breast density category B.  Right breast discomfort and feeling swollen x 2 weeks.  Not improving.  Taking anastrozole  daily.   Patient Active Problem List   Diagnosis Date Noted   Genetic testing 07/31/2022   Family history of melanoma 07/22/2022   Ductal carcinoma in situ (DCIS) of right breast 07/20/2022    is allergic to erythromycin.  MEDICAL HISTORY: Past Medical History:  Diagnosis Date   DCIS (ductal carcinoma in situ) of breast    Depression    History of radiation therapy    Right breast- 09/17/22-11/03/22- Dr. Retta Caster    SURGICAL HISTORY: Past Surgical History:  Procedure Laterality Date   APPENDECTOMY     BREAST LUMPECTOMY WITH RADIOACTIVE SEED LOCALIZATION Right 08/11/2022   Procedure:  RIGHT BREAST LUMPECTOMY WITH RADIOACTIVE SEED LOCALIZATION;  Surgeon: Sim Dryer, MD;  Location: [facility];  Service: General;  Laterality: Right;   BREAST SURGERY     DILATION AND CURETTAGE OF UTERUS      SOCIAL HISTORY: Social  History   Socioeconomic History   Marital status: Married    Spouse name: Not on file   Number of children: Not on file   Years of education: Not on file   Highest education level: Not on file  Occupational History   Not on file  Tobacco Use   Smoking status: Never   Smokeless tobacco: Never  Vaping Use   Vaping status: Never Used  Substance and Sexual Activity   Alcohol use: Yes    Comment: monthly   Drug use: No   Sexual activity: Not on file  Other Topics Concern   Not on file  Social History Narrative   Not on file   Social Drivers of Health   Financial Resource Strain: Not on file  Food Insecurity: Not on file  Transportation Needs: Not on file  Physical Activity: Not on file  Stress: Not on file  Social Connections: Not on file  Intimate Partner Violence: Not on file    FAMILY HISTORY: Family History  Problem Relation Age of Onset   Uterine cancer Mother 67   Melanoma Father 6       metastatic, agent orange   Lung cancer Paternal Grandfather        smoked    Review of Systems  Constitutional:  Negative for appetite change, chills, fatigue, fever and unexpected weight change.  HENT:   Negative for hearing loss, lump/mass and trouble swallowing.   Eyes:  Negative for eye problems and icterus.  Respiratory:  Negative for chest tightness, cough and shortness of breath.   Cardiovascular:  Negative for chest pain, leg swelling and palpitations.  Gastrointestinal:  Negative for abdominal distention, abdominal pain, constipation, diarrhea, nausea and vomiting.  Endocrine: Negative for hot flashes.  Genitourinary:  Negative for difficulty urinating.   Musculoskeletal:  Negative for arthralgias.  Skin:  Negative for itching and rash.  Neurological:  Negative for dizziness, extremity weakness, headaches and numbness.  Hematological:  Negative for adenopathy. Does not bruise/bleed easily.  Psychiatric/Behavioral:  Negative for depression. The patient is not  nervous/anxious.       PHYSICAL EXAMINATION    Vitals:   11/26/23 1403  BP: 117/78  Pulse: 89  Resp: 16  Temp: 97.9 F (36.6 C)  SpO2: 98%    Physical Exam Constitutional:      General: She is not in acute distress.    Appearance: Normal appearance. She is not toxic-appearing.  HENT:     Head: Normocephalic and atraumatic.     Mouth/Throat:     Mouth: Mucous membranes are moist.     Pharynx: Oropharynx is clear. No oropharyngeal exudate or posterior oropharyngeal erythema.  Eyes:     General: No scleral icterus. Cardiovascular:     Rate and Rhythm: Normal rate and regular rhythm.     Pulses: Normal pulses.     Heart sounds: Normal heart sounds.  Pulmonary:     Effort: Pulmonary effort is normal.     Breath sounds: Normal breath sounds.  Chest:     Comments: Right breast with swelling in upper breast noted. Abdominal:     General: Abdomen is flat. Bowel sounds are normal. There is no distension.     Palpations: Abdomen is  soft.     Tenderness: There is no abdominal tenderness.  Musculoskeletal:        General: No swelling.     Cervical back: Neck supple.  Lymphadenopathy:     Cervical: No cervical adenopathy.     Upper Body:     Right upper body: No supraclavicular or axillary adenopathy.     Left upper body: No supraclavicular or axillary adenopathy.  Skin:    General: Skin is warm and dry.     Findings: No rash.  Neurological:     General: No focal deficit present.     Mental Status: She is alert.  Psychiatric:        Mood and Affect: Mood normal.        Behavior: Behavior normal.     LABORATORY DATA:  CBC    Component Value Date/Time   WBC 5.5 07/22/2022 1219   RBC 4.59 07/22/2022 1219   HGB 13.8 07/22/2022 1219   HCT 40.7 07/22/2022 1219   PLT 301 07/22/2022 1219   MCV 88.7 07/22/2022 1219   MCH 30.1 07/22/2022 1219   MCHC 33.9 07/22/2022 1219   RDW 12.5 07/22/2022 1219   LYMPHSABS 2.3 07/22/2022 1219   MONOABS 0.3 07/22/2022 1219    EOSABS 0.2 07/22/2022 1219   BASOSABS 0.1 07/22/2022 1219    CMP     Component Value Date/Time   NA 140 07/22/2022 1219   K 3.7 07/22/2022 1219   CL 105 07/22/2022 1219   CO2 32 07/22/2022 1219   GLUCOSE 86 07/22/2022 1219   BUN 17 07/22/2022 1219   CREATININE 0.85 07/22/2022 1219   CALCIUM 9.3 07/22/2022 1219   PROT 7.1 07/22/2022 1219   ALBUMIN 4.4 07/22/2022 1219   AST 26 07/22/2022 1219   ALT 35 07/22/2022 1219   ALKPHOS 126 07/22/2022 1219   BILITOT 0.3 07/22/2022 1219   GFRNONAA >60 07/22/2022 1219     ASSESSMENT and THERAPY PLAN:   Ductal carcinoma in situ (DCIS) of right breast 08/11/2022:Right lumpectomy: DCIS 1.2 cm, margins negative (excision of the medial and superior margins had more of DCIS but final margins are negative) incidental papilloma, ER 40% weak to moderate, PR 2% moderate to strong   Adjuvant radiation started 09/21/2022-11/03/2022 Anastrozole  daily  ____________________________________________________________________________________________________________________________________________  Current Treatment: Anastrozole   Angie will continue on Anastrozole .  For breast discomfort will obtain mammogram and ultrasound of the right breast  from [facility].  Patient to f/u in 2-3 weeks to discuss next steps and whether MRI is needed.   All questions were answered. The patient knows to call the clinic with any problems, questions or concerns. We can certainly see the patient much sooner if necessary.  Total encounter time:20 minutes*in face-to-face visit time, chart review, lab review, care coordination, order entry, and documentation of the encounter time.    Alwin Baars, NP 11/26/23 3:55 PM Medical Oncology and Hematology [facility] [address] [place], [place] [address] Tel. [phone]    Fax. [phone]  *Total Encounter Time as defined by the Centers for Medicare and Medicaid Services includes, in addition to the  face-to-face time of a patient visit (documented in the note above) non-face-to-face time: obtaining and reviewing outside history, ordering and reviewing medications, tests or procedures, care coordination (communications with other health care professionals or caregivers) and documentation in the medical record.

## 2023-11-29 ENCOUNTER — Telehealth: Payer: Self-pay | Admitting: Hematology and Oncology

## 2023-11-29 NOTE — Telephone Encounter (Signed)
 Confirmed with pt scheduled appt date and time

## 2023-12-02 ENCOUNTER — Encounter: Payer: Self-pay | Admitting: Adult Health

## 2023-12-16 ENCOUNTER — Inpatient Hospital Stay: Admitting: Hematology and Oncology

## 2023-12-16 NOTE — Assessment & Plan Note (Deleted)
 08/11/2022:Right lumpectomy: DCIS 1.2 cm, margins negative (excision of the medial and superior margins had more of DCIS but final margins are negative) incidental papilloma, ER 40% weak to moderate, PR 2% moderate to strong    Adjuvant radiation started 09/21/2022-11/03/2022 Current treatment: Anastrozole  started 10/21/2022 Anastrozole  toxicities:  Return to clinic in 1 year for follow-up

## 2023-12-30 ENCOUNTER — Encounter: Payer: Self-pay | Admitting: Adult Health

## 2024-01-06 ENCOUNTER — Inpatient Hospital Stay: Attending: Adult Health | Admitting: Hematology and Oncology

## 2024-01-06 NOTE — Progress Notes (Deleted)
 HEMATOLOGY-ONCOLOGY TELEPHONE VISIT PROGRESS NOTE  I connected with our patient on 01/06/24 at  3:30 PM EDT by telephone and verified that I am speaking with the correct person using two identifiers.  I discussed the limitations, risks, security and privacy concerns of performing an evaluation and management service by telephone and the availability of in person appointments.  I also discussed with the patient that there may be a patient responsible charge related to this service. The patient expressed understanding and agreed to proceed.   History of Present Illness:   History of Present Illness     Oncology History  Ductal carcinoma in situ (DCIS) of right breast  07/15/2022 Initial Diagnosis   Focal pain in bilateral breasts. mammogram detected Occasions and asymmetry in the right breast upper outer quadrant (implants).  No ultrasound correlate.  Biopsy revealed high-grade DCIS with necrosis ER 40% PR 2%   07/22/2022 Cancer Staging   Staging form: Breast, AJCC 8th Edition - Clinical: Stage 0 (cTis (DCIS), cN0, cM0, ER+, PR+, HER2: Not Assessed) - Signed by Odean Potts, MD on 07/22/2022 Stage prefix: Initial diagnosis Nuclear grade: G3    Genetic Testing   [facility] CancerNext-Expanded Panel+RNA was Negative. 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 Report date is 07/29/2022.  The CancerNext-Expanded gene panel offered by [facility] and includes sequencing, rearrangement, and RNA analysis for the following 77 genes: AIP, ALK, APC, ATM, AXIN2, BAP1, BARD1, BLM, BMPR1A, BRCA1, BRCA2, BRIP1, CDC73, CDH1, CDK4, CDKN1B, CDKN2A, CHEK2, CTNNA1, DICER1, FANCC, FH, FLCN, GALNT12, KIF1B, LZTR1, MAX, MEN1, MET, MLH1, MSH2, MSH3, MSH6, MUTYH, NBN, NF1, NF2, NTHL1, PALB2, PHOX2B, PMS2, POT1, PRKAR1A, PTCH1, PTEN, RAD51C, RAD51D, RB1, RECQL, RET, SDHA, SDHAF2, SDHB, SDHC, SDHD, SMAD4, SMARCA4, SMARCB1, SMARCE1, STK11, SUFU, TMEM127, TP53, TSC1, TSC2, VHL and  XRCC2 (sequencing and deletion/duplication); EGFR, EGLN1, HOXB13, KIT, MITF, PDGFRA, POLD1, and POLE (sequencing only); EPCAM and GREM1 (deletion/duplication only).    08/11/2022 Surgery   Right lumpectomy: DCIS 1.2 cm, margins negative (excision of the medial and superior margins had more of DCIS but final margins are negative) incidental papilloma, ER 40% weak to moderate, PR 2% moderate to strong   09/17/2022 - 11/03/2022 Radiation Therapy   Plan Name: Breast_R Site: Breast, Right Technique: 3D Mode: Photon Dose Per Fraction: 1.8 Gy Prescribed Dose (Delivered / Prescribed): 50.4 Gy / 50.4 Gy Prescribed Fxs (Delivered / Prescribed): 28 / 28   Plan Name: Breast_R_Bst Site: Breast, Right Technique: Electron Mode: Electron Dose Per Fraction: 2 Gy Prescribed Dose (Delivered / Prescribed): 12 Gy / 12 Gy Prescribed Fxs (Delivered / Prescribed): 6 / 6       REVIEW OF SYSTEMS:   Constitutional: Denies fevers, chills or abnormal weight loss All other systems were reviewed with the patient and are negative. Observations/Objective:     Assessment Plan:  Ductal carcinoma in situ (DCIS) of right breast 08/11/2022:Right lumpectomy: DCIS 1.2 cm, margins negative (excision of the medial and superior margins had more of DCIS but final margins are negative) incidental papilloma, ER 40% weak to moderate, PR 2% moderate to strong    Adjuvant radiation started 09/21/2022-11/03/2022   Treatment plan: Anastrozole  1 mg daily started 10/21/2022 Anastrozole  toxicities: Tolerating it extremely well Breast cancer surveillance: Breast exam 01/06/2024: Benign Mammogram ultrasound right breast for breast pain: Benign seroma detected, diagnostic mammogram in 6 months 07/19/2024  Return to clinic in 1 year for follow-up  --------------------------------- Assessment and Plan Assessment & Plan  I discussed the assessment and treatment plan with the patient. The patient was provided an opportunity to  ask questions and all were answered. The patient agreed with the plan and demonstrated an understanding of the instructions. The patient was advised to call back or seek an in-person evaluation if the symptoms worsen or if the condition fails to improve as anticipated.   I provided *** minutes of non-face-to-face time during this encounter.  This includes time for charting and coordination of care   Naomi MARLA Chad, MD

## 2024-01-06 NOTE — Assessment & Plan Note (Deleted)
 08/11/2022:Right lumpectomy: DCIS 1.2 cm, margins negative (excision of the medial and superior margins had more of DCIS but final margins are negative) incidental papilloma, ER 40% weak to moderate, PR 2% moderate to strong    Adjuvant radiation started 09/21/2022-11/03/2022   Treatment plan: Anastrozole  1 mg daily started 10/21/2022 Anastrozole  toxicities: Tolerating it extremely well Breast cancer surveillance: Breast exam 01/06/2024: Benign Mammogram ultrasound right breast for breast pain: Benign seroma detected, diagnostic mammogram in 6 months 07/19/2024  Return to clinic in 1 year for follow-up

## 2024-08-01 ENCOUNTER — Encounter: Payer: Self-pay | Admitting: Adult Health

## 2024-08-14 ENCOUNTER — Inpatient Hospital Stay: Payer: 59 | Admitting: Hematology and Oncology
